# Patient Record
Sex: Female | Born: 1950 | Race: White | Hispanic: No | Marital: Married | Smoking: Never smoker
Health system: Southern US, Community
[De-identification: ages and names within clinical notes are randomized; demographics above are authoritative.]

## PROBLEM LIST (undated history)

## (undated) DIAGNOSIS — Z803 Family history of malignant neoplasm of breast: Secondary | ICD-10-CM

## (undated) DIAGNOSIS — Z87442 Personal history of urinary calculi: Secondary | ICD-10-CM

## (undated) DIAGNOSIS — D3610 Benign neoplasm of peripheral nerves and autonomic nervous system, unspecified: Secondary | ICD-10-CM

## (undated) DIAGNOSIS — E785 Hyperlipidemia, unspecified: Secondary | ICD-10-CM

## (undated) DIAGNOSIS — R0602 Shortness of breath: Secondary | ICD-10-CM

## (undated) DIAGNOSIS — Z8719 Personal history of other diseases of the digestive system: Secondary | ICD-10-CM

## (undated) DIAGNOSIS — M719 Bursopathy, unspecified: Secondary | ICD-10-CM

## (undated) DIAGNOSIS — N200 Calculus of kidney: Secondary | ICD-10-CM

## (undated) DIAGNOSIS — K219 Gastro-esophageal reflux disease without esophagitis: Secondary | ICD-10-CM

## (undated) DIAGNOSIS — M810 Age-related osteoporosis without current pathological fracture: Secondary | ICD-10-CM

## (undated) DIAGNOSIS — M1990 Unspecified osteoarthritis, unspecified site: Secondary | ICD-10-CM

## (undated) DIAGNOSIS — R0989 Other specified symptoms and signs involving the circulatory and respiratory systems: Secondary | ICD-10-CM

## (undated) HISTORY — DX: Hyperlipidemia, unspecified: E78.5

## (undated) HISTORY — DX: Shortness of breath: R06.02

## (undated) HISTORY — DX: Family history of malignant neoplasm of breast: Z80.3

## (undated) HISTORY — DX: Age-related osteoporosis without current pathological fracture: M81.0

## (undated) HISTORY — DX: Bursopathy, unspecified: M71.9

## (undated) HISTORY — PX: VULVA /PERINEUM BIOPSY: SHX319

## (undated) HISTORY — DX: Gastro-esophageal reflux disease without esophagitis: K21.9

## (undated) HISTORY — DX: Other specified symptoms and signs involving the circulatory and respiratory systems: R09.89

## (undated) HISTORY — DX: Benign neoplasm of peripheral nerves and autonomic nervous system, unspecified: D36.10

---
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
## 2001-04-20 HISTORY — PX: BREAST EXCISIONAL BIOPSY: SUR124

## 2005-03-31 ENCOUNTER — Other Ambulatory Visit: Admission: RE | Admit: 2005-03-31 | Discharge: 2005-03-31 | Payer: Self-pay | Admitting: Anesthesiology

## 2007-08-22 ENCOUNTER — Ambulatory Visit: Payer: Self-pay | Admitting: Family Medicine

## 2008-08-22 ENCOUNTER — Ambulatory Visit: Payer: Self-pay | Admitting: Family Medicine

## 2009-12-13 ENCOUNTER — Ambulatory Visit: Payer: Self-pay | Admitting: Family Medicine

## 2010-09-19 HISTORY — PX: OTHER SURGICAL HISTORY: SHX169

## 2010-09-22 ENCOUNTER — Ambulatory Visit: Payer: Self-pay | Admitting: Family Medicine

## 2010-09-26 ENCOUNTER — Ambulatory Visit: Payer: Self-pay | Admitting: Oncology

## 2010-10-06 ENCOUNTER — Ambulatory Visit: Payer: Self-pay | Admitting: Oncology

## 2010-10-10 LAB — PATHOLOGY REPORT

## 2010-10-19 ENCOUNTER — Ambulatory Visit: Payer: Self-pay | Admitting: Oncology

## 2010-11-21 ENCOUNTER — Encounter (INDEPENDENT_AMBULATORY_CARE_PROVIDER_SITE_OTHER): Payer: Self-pay | Admitting: General Surgery

## 2010-11-21 ENCOUNTER — Ambulatory Visit (INDEPENDENT_AMBULATORY_CARE_PROVIDER_SITE_OTHER): Payer: BC Managed Care – PPO | Admitting: General Surgery

## 2010-11-21 VITALS — BP 124/80 | HR 64 | Temp 96.8°F | Ht 66.0 in | Wt 170.0 lb

## 2010-11-21 DIAGNOSIS — D3610 Benign neoplasm of peripheral nerves and autonomic nervous system, unspecified: Secondary | ICD-10-CM | POA: Insufficient documentation

## 2010-11-21 DIAGNOSIS — D219 Benign neoplasm of connective and other soft tissue, unspecified: Secondary | ICD-10-CM

## 2010-11-21 NOTE — Patient Instructions (Signed)
Please call us when you decide if you want to have surgery or monitor the Schwannoma.

## 2010-11-21 NOTE — Progress Notes (Signed)
Emily Richard is a 60 y.o. female.    Chief Complaint  Patient presents with  . Other    Eval retroperitoneal tumor    HPI HPI She is referred by Dr.Kothapalli for evaluation of a left retroperitoneal tumor. She underwent a CT scan for evaluation of kidney stones. An incidental 4.5 cm x 3.3 cm soft tissue mass on the left psoas muscle inferior to the left adrenal gland that was noted. An image guided needle biopsy was performed of this mass. 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 This is asymptomatic. No left lower extremity paresthesias or weakness.  Past Medical History  Diagnosis Date  . Poor circulation Nephrolithiasis Osteoporosis   . Shortness of breath   . Family history of breast cancer     mother, aunt    Past Surgical History  Procedure Date  . Tumor biopsy 09/2010    Family History  Problem Relation Age of Onset  . Other Mother     left mastectomy 1998  . Osteoporosis Mother     Social History History  Substance Use Topics  . Smoking status: Never Smoker   . Smokeless tobacco: Not on file  . Alcohol Use: No    Allergies  Allergen Reactions  . Penicillins Hives and Itching    All over body.    Current Outpatient Prescriptions  Medication Sig Dispense Refill  . aspirin 81 MG tablet Take 81 mg by mouth daily.        . Calcium Carbonate-Vitamin D (CALTRATE 600+D PO) Take by mouth.        . fish oil-omega-3 fatty acids 1000 MG capsule Take 2 g by mouth daily.        . multivitamin-iron-minerals-folic acid (CENTRUM) chewable tablet Chew 1 tablet by mouth daily.        . raloxifene (EVISTA) 60 MG tablet Take 60 mg by mouth daily.        . rosuvastatin (CRESTOR) 5 MG tablet Take 5 mg by mouth daily.          Review of Systems ROS General:  _X_Normal __Fever__Weight change __Night sweats __Fatigue __Sleep loss  Breast:  _X_Normal __Lump __Pain __Nipple discharge __Infection __Other  Infectious  Diseases:  _X_Normal __HIV/AIDS __Tuberculosis __Hepatitis A       __ Hepatitis B __Hepatitis C __ STD __MRSA(staph infection) __Other  Dental:  _X_Normal __Dentures __Other  Cardiac  :_X_ Normal __Pacemaker __Defibrillator __Bypass surgery __High blood pressure __ Peripheral vascular disease __Heart attack __Irregular heart beat __Valve       disease  Pulmonary:  _X_Normal __Cough/Sputum __Bronchitis __Asthma __COPD                    __Short of breath __Pneumonia __Sleep Apnea  Endocrine:  _X_Normal __Diabetes __Thyroid disease __High Cholesterol __Other  Skin:  _X_Normal  __Rash __Bruise easily __Cancer __Abnormal moles __Other  Gastrointestinal:  _X_Normal __Nausea/Vomiting/Diarrhea __Colon polyp or Cancer       __Irritable Bowel disease __Poor appetite __Hiatal hernia or reflux __Ulcer       __Liver disease __Abdominal pain __Hernia __Rectal bleeding or hemorrhoids       __Other  Genitourinary:  _Normal _X__Kidney disease or stones __Prostate problems        __Blood in urine __Difficulty voiding __Incontinence/leakage __Other  Neurological:  _X_Normal __Stroke __Paralysis __Seizure __Alzheimers disease       __Headaches __Dizziness __Fainting __Weakness __Other  Hematologic/Lymphatic:  _X_Normal __Bleeding disorder __Blood clots __Anemia  __Swollen lymph nodes __Blood Transfusion __Other  Immune System:  _X_Normal __Previous/Current Cancer-type:  __Other  HEENT:  _X_Normal __Hearing loss  __Hearing aid __Ear infection __Nose bleed       __Hoarseness __Sore throat __Blurred or double vision __Glasses or contacts       __Glaucoma __Retinopathy __Macular degeneration __Other  Musculoskeletal:  __Normal _X_Joint pain __Arthritis __Other        Physical Exam Physical Exam  Constitutional: No distress.       Overweight.  HENT:  Head: Normocephalic and atraumatic.  Eyes: Conjunctivae and EOM are normal.  Neck: Normal range of motion. No JVD present.  Cardiovascular:  Normal rate and regular rhythm.   No murmur heard. Respiratory: Effort normal and breath sounds normal.  GI: Soft. Bowel sounds are normal. She exhibits no distension and no mass. There is no tenderness.  Musculoskeletal: Normal range of motion. She exhibits no edema.  Lymphadenopathy:    She has no cervical adenopathy.  Neurological: She is alert. She exhibits normal muscle tone.       Normal lower extremity motor strength and sensation.  Skin: Skin is warm and dry. No rash noted.  Psychiatric: She has a normal mood and affect. Judgment normal.     Blood pressure 124/80, pulse 64, temperature 96.8 F (36 C), temperature source Temporal, height 5\' 6"  (1.676 m), weight 170 lb (77.111 kg).  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  Plan: We discussed 2 options. The first option would be serial CT scans to follow the mass. If it became larger and excision would be the plan. The second option would be to go straight to excision. We did discuss the procedure and risks of this. Risks include but not limited to bleeding, infection, wound problems, anesthesia, permanent nerve injury and left lower extremity weakness, accidental injury to intra-abdominal organs.  She and her husband want to think about this and will call us with their decision. If she decides and observation, will obtain a repeat CT scan in September. Of note is that her mother is going to have joint replacement surgery later this month and she will take care of her.  Emily Richard 11/21/2010, 6:10 PM

## 2010-11-24 ENCOUNTER — Telehealth (INDEPENDENT_AMBULATORY_CARE_PROVIDER_SITE_OTHER): Payer: Self-pay

## 2010-11-24 NOTE — Telephone Encounter (Signed)
Contacted Radiology at [facility].  They will burn pt's CT scan to a CD.  Called pt and gave them our mailing address.  They will go by hospital to sign release and get CD mailed to [place].

## 2010-12-01 ENCOUNTER — Telehealth (INDEPENDENT_AMBULATORY_CARE_PROVIDER_SITE_OTHER): Payer: Self-pay | Admitting: General Surgery

## 2010-12-02 ENCOUNTER — Telehealth (INDEPENDENT_AMBULATORY_CARE_PROVIDER_SITE_OTHER): Payer: Self-pay

## 2010-12-02 NOTE — Telephone Encounter (Signed)
Spoke with the pt. This morning.  We received the CD of her recent scan.  I told her Dr. Abbey Chatters would review it this week and we would let her know if the decision was to defer surgery at this time, and order another CT in December.

## 2010-12-03 ENCOUNTER — Telehealth (INDEPENDENT_AMBULATORY_CARE_PROVIDER_SITE_OTHER): Payer: Self-pay | Admitting: General Surgery

## 2010-12-04 ENCOUNTER — Other Ambulatory Visit (INDEPENDENT_AMBULATORY_CARE_PROVIDER_SITE_OTHER): Payer: Self-pay | Admitting: General Surgery

## 2010-12-04 ENCOUNTER — Telehealth (INDEPENDENT_AMBULATORY_CARE_PROVIDER_SITE_OTHER): Payer: Self-pay | Admitting: General Surgery

## 2010-12-04 DIAGNOSIS — D200 Benign neoplasm of soft tissue of retroperitoneum: Secondary | ICD-10-CM

## 2010-12-04 NOTE — Telephone Encounter (Signed)
I reviewed the CT on a CD ROM disc.  She has decided not to pursue surgery at this time, but rather have radiographic monitoring as we had discussed.  Thus, will arrange for a repeat CT scan in early September and call her with the results.  If the benign nerve sheath tumor gets larger, I recommended removal and she understands this.

## 2010-12-09 ENCOUNTER — Ambulatory Visit
Admission: RE | Admit: 2010-12-09 | Discharge: 2010-12-09 | Disposition: A | Discharge status: Home / Self Care | Payer: BC Managed Care – PPO | Source: Ambulatory Visit | Attending: General Surgery | Admitting: General Surgery

## 2010-12-09 DIAGNOSIS — D200 Benign neoplasm of soft tissue of retroperitoneum: Secondary | ICD-10-CM

## 2010-12-09 MED ORDER — IOHEXOL 300 MG/ML  SOLN
100.0000 mL | Freq: Once | INTRAMUSCULAR | Status: AC | PRN
  Administered 2010-12-09: 100 mL via INTRAVENOUS

## 2010-12-12 ENCOUNTER — Telehealth (INDEPENDENT_AMBULATORY_CARE_PROVIDER_SITE_OTHER): Payer: Self-pay | Admitting: General Surgery

## 2010-12-12 NOTE — Telephone Encounter (Signed)
Spoke with her yesterday morning and today.  CT scan done her in [place] was compared with outside CT scan by Dr. Gery Pray in radiology and an addendum was added.  Essentially, the size of the tumor has not significantly changed.  She also has a 6mm ureteral stone at the UVJ with mild proximal hydroureter.  She has an appointment with her Urologist on 12/15/10 as it is symptomatic.  As for the benign retroperitoneal nerve sheath tumor, we agreed on a repeat CT scan 02/2011 and followup appointment.

## 2010-12-19 ENCOUNTER — Telehealth (INDEPENDENT_AMBULATORY_CARE_PROVIDER_SITE_OTHER): Payer: Self-pay

## 2010-12-19 NOTE — Telephone Encounter (Signed)
LMOM for a follow up scan in November and clinic appt afterwards.

## 2011-02-09 ENCOUNTER — Other Ambulatory Visit (INDEPENDENT_AMBULATORY_CARE_PROVIDER_SITE_OTHER): Payer: Self-pay | Admitting: General Surgery

## 2011-02-09 DIAGNOSIS — D3610 Benign neoplasm of peripheral nerves and autonomic nervous system, unspecified: Secondary | ICD-10-CM

## 2011-02-18 ENCOUNTER — Ambulatory Visit
Admission: RE | Admit: 2011-02-18 | Discharge: 2011-02-18 | Disposition: A | Discharge status: Home / Self Care | Payer: BC Managed Care – PPO | Source: Ambulatory Visit | Attending: General Surgery | Admitting: General Surgery

## 2011-02-18 DIAGNOSIS — D3610 Benign neoplasm of peripheral nerves and autonomic nervous system, unspecified: Secondary | ICD-10-CM

## 2011-02-18 MED ORDER — IOHEXOL 300 MG/ML  SOLN
100.0000 mL | Freq: Once | INTRAMUSCULAR | Status: AC | PRN
  Administered 2011-02-18: 100 mL via INTRAVENOUS

## 2011-03-04 ENCOUNTER — Encounter (INDEPENDENT_AMBULATORY_CARE_PROVIDER_SITE_OTHER): Payer: BC Managed Care – PPO | Admitting: General Surgery

## 2011-03-25 ENCOUNTER — Encounter (INDEPENDENT_AMBULATORY_CARE_PROVIDER_SITE_OTHER): Payer: Self-pay | Admitting: General Surgery

## 2011-03-25 ENCOUNTER — Ambulatory Visit (INDEPENDENT_AMBULATORY_CARE_PROVIDER_SITE_OTHER): Payer: BC Managed Care – PPO | Admitting: General Surgery

## 2011-03-25 VITALS — BP 138/76 | HR 64 | Temp 97.6°F | Resp 16 | Ht 66.0 in | Wt 174.6 lb

## 2011-03-25 DIAGNOSIS — D3610 Benign neoplasm of peripheral nerves and autonomic nervous system, unspecified: Secondary | ICD-10-CM

## 2011-03-25 DIAGNOSIS — D219 Benign neoplasm of connective and other soft tissue, unspecified: Secondary | ICD-10-CM

## 2011-03-25 NOTE — Patient Instructions (Signed)
You will need a repeat CT scan in April 2013.

## 2011-03-25 NOTE — Progress Notes (Signed)
Patient ID: Emily Richard, female   DOB: [birth year], 60 y.o.   MRN: [id number]  Chief Complaint  Patient presents with  . Follow-up    est pt- eval schwannoma and discuss CT results    HPI Emily Richard is a 60 y.o. female.   HPI  She is here for followup of her left retroperitoneal schwannoma. She's been having some mild discomfort in the left lower quadrant were certain movements. Her CT scan in October demonstrated no change in the size of the schwannoma.  Past Medical History  Diagnosis Date  . Poor circulation   . Shortness of breath   . Family history of breast cancer     mother, aunt  . Hyperlipidemia     Past Surgical History  Procedure Date  . Tumor biopsy 09/2010    Family History  Problem Relation Age of Onset  . Other Mother     left mastectomy 1998  . Osteoporosis Mother   . Cancer Mother     breast  . Cancer Maternal Aunt     breast    Social History History  Substance Use Topics  . Smoking status: Never Smoker   . Smokeless tobacco: Not on file  . Alcohol Use: No    Allergies  Allergen Reactions  . Penicillins Hives and Itching    All over body.    Current Outpatient Prescriptions  Medication Sig Dispense Refill  . aspirin 81 MG tablet Take 81 mg by mouth daily.        Marland Kitchen atorvastatin (LIPITOR) 10 MG tablet daily.      . Calcium Carbonate-Vitamin D (CALTRATE 600+D PO) Take by mouth.        . fish oil-omega-3 fatty acids 1000 MG capsule Take 2 g by mouth daily.        . multivitamin-iron-minerals-folic acid (CENTRUM) chewable tablet Chew 1 tablet by mouth daily.        . raloxifene (EVISTA) 60 MG tablet Take 60 mg by mouth daily.          Review of Systems Review of Systems  Gastrointestinal:       See hpi    Blood pressure 138/76, pulse 64, temperature 97.6 F (36.4 C), temperature source Temporal, resp. rate 16, height 5\' 6"  (1.676 m), weight 174 lb 9.6 oz (79.198 kg).  Physical Exam Physical Exam  Constitutional:       Overweight  in NAD.  Abdominal: Soft. She exhibits no mass. There is tenderness (mild llq).  Genitourinary:       No inguinal hernias.    Data Reviewed CT scan  Assessment    Left retroperitoneal schwannoma- stable in size.    Plan       Repeat CT scan in April 2013. Followup appointment after that.   Davison Ohms J 03/25/2011, 10:38 AM

## 2011-04-16 NOTE — Telephone Encounter (Signed)
Close encounter 

## 2011-05-19 ENCOUNTER — Ambulatory Visit: Payer: Self-pay | Admitting: Family Medicine

## 2011-08-31 ENCOUNTER — Telehealth (INDEPENDENT_AMBULATORY_CARE_PROVIDER_SITE_OTHER): Payer: Self-pay | Admitting: General Surgery

## 2011-08-31 NOTE — Telephone Encounter (Signed)
Pt calling to report new pain in Lt hip.  She states she is due to be seen for 6 month follow-up (scheduled,) but a CT is due about now.  Her Lt hip and whole left side is painful, interrupting sleep now, too.

## 2011-09-01 ENCOUNTER — Other Ambulatory Visit (INDEPENDENT_AMBULATORY_CARE_PROVIDER_SITE_OTHER): Payer: Self-pay | Admitting: General Surgery

## 2011-09-01 DIAGNOSIS — R1900 Intra-abdominal and pelvic swelling, mass and lump, unspecified site: Secondary | ICD-10-CM

## 2011-09-08 ENCOUNTER — Telehealth (INDEPENDENT_AMBULATORY_CARE_PROVIDER_SITE_OTHER): Payer: Self-pay

## 2011-09-08 NOTE — Telephone Encounter (Signed)
LMOV pt's f/u appt is 6/19.  We will call her with CT results from 09/09/11.

## 2011-09-09 ENCOUNTER — Ambulatory Visit
Admission: RE | Admit: 2011-09-09 | Discharge: 2011-09-09 | Disposition: A | Discharge status: Home / Self Care | Payer: BC Managed Care – PPO | Source: Ambulatory Visit | Attending: General Surgery | Admitting: General Surgery

## 2011-09-09 DIAGNOSIS — R1900 Intra-abdominal and pelvic swelling, mass and lump, unspecified site: Secondary | ICD-10-CM

## 2011-09-09 MED ORDER — IOHEXOL 300 MG/ML  SOLN
100.0000 mL | Freq: Once | INTRAMUSCULAR | Status: AC | PRN
  Administered 2011-09-09: 100 mL via INTRAVENOUS

## 2011-09-11 ENCOUNTER — Telehealth (INDEPENDENT_AMBULATORY_CARE_PROVIDER_SITE_OTHER): Payer: Self-pay

## 2011-09-11 ENCOUNTER — Encounter (INDEPENDENT_AMBULATORY_CARE_PROVIDER_SITE_OTHER): Payer: Self-pay | Admitting: General Surgery

## 2011-09-11 DIAGNOSIS — M25552 Pain in left hip: Secondary | ICD-10-CM

## 2011-09-11 NOTE — Telephone Encounter (Signed)
I called the pt and notified her that Dr Abbey Chatters says her pain could be from the tumor but we are not sure.  He wants to see an orthopedic specialist to rule out any bone disease.  He recommends Dr Luiz Blare, Dr Margreta Journey or someone in [facility].  The pt agrees.  She has not seen anyone else before.  We will arrange

## 2011-09-11 NOTE — Telephone Encounter (Signed)
The pt called for her ct results.  I gave those to her and told her the tumor is stable.   She states she still has pain in the left groin and hip area especially when she walks.  She wants to know what to do about that and if she should come here or follow up with another md.  I will contact Dr Abbey Chatters.

## 2011-09-11 NOTE — Progress Notes (Signed)
Patient ID: Emily Richard, female   DOB: February 07, 1951, 61 y.o.   MRN: [id number] I spoke with her regarding her CT scan report.  The schwannoma is unchanged.  She has been having more left hip and groin pain.  She thinks she may have some arthritis in her left hip.  Will arrange for an Orthopedic Surgery consult regarding her left hip pain.  If they do not find any specific hip pathology to explain the pain, then we may need to proceed with removal of the schwannoma.  Would need a Urologist to be involved with the operation as well.  She understands that permanent nerve impairment could occur as a result of the operation.  Will see her next month in the office.

## 2011-09-15 ENCOUNTER — Telehealth (INDEPENDENT_AMBULATORY_CARE_PROVIDER_SITE_OTHER): Payer: Self-pay

## 2011-09-15 NOTE — Telephone Encounter (Signed)
The pt called this am because she was referred to Dr Luiz Blare this am and is now at [facility] but they don't have her appointment down.  That is not Dr Luiz Blare' office.  I checked into it and found out where Dr Luiz Blare is and it's Music therapist.  I advised the pt where to go.  They are aware she may be late.

## 2011-09-21 ENCOUNTER — Ambulatory Visit (INDEPENDENT_AMBULATORY_CARE_PROVIDER_SITE_OTHER): Payer: BC Managed Care – PPO | Admitting: General Surgery

## 2011-09-23 ENCOUNTER — Ambulatory Visit (INDEPENDENT_AMBULATORY_CARE_PROVIDER_SITE_OTHER): Payer: BC Managed Care – PPO | Admitting: General Surgery

## 2011-10-07 ENCOUNTER — Encounter (INDEPENDENT_AMBULATORY_CARE_PROVIDER_SITE_OTHER): Payer: Self-pay | Admitting: General Surgery

## 2011-10-07 ENCOUNTER — Ambulatory Visit (INDEPENDENT_AMBULATORY_CARE_PROVIDER_SITE_OTHER): Payer: BC Managed Care – PPO | Admitting: General Surgery

## 2011-10-07 VITALS — BP 130/81 | HR 69 | Temp 98.1°F | Resp 16 | Ht 66.5 in | Wt 164.4 lb

## 2011-10-07 DIAGNOSIS — D3610 Benign neoplasm of peripheral nerves and autonomic nervous system, unspecified: Secondary | ICD-10-CM

## 2011-10-07 DIAGNOSIS — D219 Benign neoplasm of connective and other soft tissue, unspecified: Secondary | ICD-10-CM

## 2011-10-07 NOTE — Patient Instructions (Signed)
We will schedule you for a repeat CT about 6 months from your last CT.

## 2011-10-07 NOTE — Progress Notes (Signed)
Patient ID: Emily Richard, female   DOB: [birth year], 61 y.o.   MRN: [id number]  Chief Complaint  Patient presents with  . Follow-up    reck rectal    HPI [name] is a 61 y.o. female.   HPI  She is here for followup of her left retroperitoneal schwannoma. She's been having some moderate discomfort in the left hip area.  I referred her to Dr. Jodi Geralds for evaluation.  She was found to have some left hip pathology and was given a steroid injection which has helped her significantly. Her CT scan in May demonstrated no change in the size of the schwannoma.  Past Medical History  Diagnosis Date  . Poor circulation   . Shortness of breath   . Family history of breast cancer     mother, aunt  . Hyperlipidemia     Left retroperitoneal schwannoma  Past Surgical History  Procedure Date  . Tumor biopsy 09/2010    Family History  Problem Relation Age of Onset  . Other Mother     left mastectomy 1998  . Osteoporosis Mother   . Cancer Mother     breast  . Cancer Maternal Aunt     breast    Social History History  Substance Use Topics  . Smoking status: Never Smoker   . Smokeless tobacco: Not on file  . Alcohol Use: No    Allergies  Allergen Reactions  . Penicillins Hives and Itching    All over body.    Current Outpatient Prescriptions  Medication Sig Dispense Refill  . aspirin 81 MG tablet Take 81 mg by mouth daily.        Marland Kitchen atorvastatin (LIPITOR) 10 MG tablet daily.      . Calcium Carbonate-Vitamin D (CALTRATE 600+D PO) Take by mouth.        . Clobetasol Prop Emollient Base 0.05 % emollient cream Apply topically 3 (three) times a week.      . Estrogens, Conjugated (PREMARIN VA) Place vaginally.      . fish oil-omega-3 fatty acids 1000 MG capsule Take 2 g by mouth daily.        . multivitamin-iron-minerals-folic acid (CENTRUM) chewable tablet Chew 1 tablet by mouth daily.        . raloxifene (EVISTA) 60 MG tablet Take 60 mg by mouth daily.          Review of  Systems Review of Systems  Gastrointestinal:       See hpi    Blood pressure 130/81, pulse 69, temperature 98.1 F (36.7 C), temperature source Temporal, resp. rate 16, height 5' 6.5" (1.689 m), weight 164 lb 6.4 oz (74.571 kg), SpO2 98.00%.  Physical Exam Physical Exam  Constitutional:       Overweight in NAD.  Abdominal: Soft, no mass or tenderness. Lymph nodes:  No inguinal or cervical adenopathy   Data Reviewed CT scan  Assessment    Left retroperitoneal schwannoma- unchanged and asymptomatic in my opinion.  Left hip trochanteric bursitis-improved after steroid injection.    Plan       Repeat CT scan in 6 months. Followup appointment after that.   Peggye Poon J 10/07/2011, 12:45 PM

## 2012-03-09 ENCOUNTER — Ambulatory Visit
Admission: RE | Admit: 2012-03-09 | Discharge: 2012-03-09 | Disposition: A | Discharge status: Home / Self Care | Payer: BC Managed Care – PPO | Source: Ambulatory Visit | Attending: General Surgery | Admitting: General Surgery

## 2012-03-09 DIAGNOSIS — D3610 Benign neoplasm of peripheral nerves and autonomic nervous system, unspecified: Secondary | ICD-10-CM

## 2012-03-09 MED ORDER — IOHEXOL 300 MG/ML  SOLN
100.0000 mL | Freq: Once | INTRAMUSCULAR | Status: AC | PRN
  Administered 2012-03-09: 100 mL via INTRAVENOUS

## 2012-03-11 ENCOUNTER — Telehealth (INDEPENDENT_AMBULATORY_CARE_PROVIDER_SITE_OTHER): Payer: Self-pay

## 2012-03-11 NOTE — Telephone Encounter (Signed)
LMOV pt's CT scan showed stable left retroperitoneal soft tissue mass.  No changes since last imaging report.  Her appointment on 12/30 was rescheduled to 03/30/12 with Dr. Abbey Chatters.  He will go over the results in detail at that time.

## 2012-03-30 ENCOUNTER — Ambulatory Visit (INDEPENDENT_AMBULATORY_CARE_PROVIDER_SITE_OTHER): Payer: BC Managed Care – PPO | Admitting: General Surgery

## 2012-03-30 ENCOUNTER — Encounter (INDEPENDENT_AMBULATORY_CARE_PROVIDER_SITE_OTHER): Payer: Self-pay | Admitting: General Surgery

## 2012-03-30 VITALS — BP 110/58 | HR 72 | Temp 97.7°F | Resp 18 | Ht 66.0 in | Wt 165.8 lb

## 2012-03-30 DIAGNOSIS — D219 Benign neoplasm of connective and other soft tissue, unspecified: Secondary | ICD-10-CM

## 2012-03-30 DIAGNOSIS — D3610 Benign neoplasm of peripheral nerves and autonomic nervous system, unspecified: Secondary | ICD-10-CM

## 2012-03-30 NOTE — Patient Instructions (Signed)
We will repeat the CT in 6 months.

## 2012-03-30 NOTE — Progress Notes (Signed)
Patient ID: Emily Richard, female   DOB: 25-Feb-1951, 61 y.o.   MRN: [id number]  No chief complaint on file.   HPI Emily Richard is a 61 y.o. female.   HPI  She is here for followup of her left retroperitoneal benign schwannoma (biopsy proven).  She is not having any abdominal complaints.  Past Medical History  Diagnosis Date  . Poor circulation   . Shortness of breath   . Family history of breast cancer     mother, aunt  . Hyperlipidemia   . Schwannoma     Past Surgical History  Procedure Date  . Tumor biopsy 09/2010    Family History  Problem Relation Age of Onset  . Other Mother     left mastectomy 1998  . Osteoporosis Mother   . Cancer Mother     breast  . Cancer Maternal Aunt     breast    Social History History  Substance Use Topics  . Smoking status: Never Smoker   . Smokeless tobacco: Not on file  . Alcohol Use: No    Allergies  Allergen Reactions  . Penicillins Hives and Itching    All over body.    Current Outpatient Prescriptions  Medication Sig Dispense Refill  . aspirin 81 MG tablet Take 81 mg by mouth daily.        Marland Kitchen atorvastatin (LIPITOR) 10 MG tablet daily.      . Calcium Carbonate-Vitamin D (CALTRATE 600+D PO) Take by mouth.        . Clobetasol Prop Emollient Base 0.05 % emollient cream Apply topically 3 (three) times a week.      . Estrogens, Conjugated (PREMARIN VA) Place vaginally.      . fish oil-omega-3 fatty acids 1000 MG capsule Take 2 g by mouth daily.        . multivitamin-iron-minerals-folic acid (CENTRUM) chewable tablet Chew 1 tablet by mouth daily.        . raloxifene (EVISTA) 60 MG tablet Take 60 mg by mouth daily.          Review of Systems Review of Systems  Gastrointestinal: Negative for abdominal pain.  Musculoskeletal: Positive for arthralgias.  Psychiatric/Behavioral: Behavioral problem: left hip.    Blood pressure 110/58, pulse 72, temperature 97.7 F (36.5 C), temperature source Temporal, resp. rate 18, height  5\' 6"  (1.676 m), weight 165 lb 12.8 oz (75.206 kg).  Physical Exam Physical Exam  Constitutional: She appears well-developed and well-nourished. No distress.  Abdominal: Soft. She exhibits no mass. There is no tenderness.    Data Reviewed CT scan-schwannoma size essentially unchanged.  Assessment    Left retroperitoneal schwannoma-essentially unchanged.    Plan    Repeat CT and follow-up visit in 6 months.       Mat Stuard J 03/30/2012, 11:53 AM

## 2012-04-18 ENCOUNTER — Ambulatory Visit (INDEPENDENT_AMBULATORY_CARE_PROVIDER_SITE_OTHER): Payer: BC Managed Care – PPO | Admitting: General Surgery

## 2012-06-22 ENCOUNTER — Ambulatory Visit: Payer: Self-pay | Admitting: Family Medicine

## 2012-10-31 ENCOUNTER — Telehealth (INDEPENDENT_AMBULATORY_CARE_PROVIDER_SITE_OTHER): Payer: Self-pay | Admitting: General Surgery

## 2012-10-31 DIAGNOSIS — D3610 Benign neoplasm of peripheral nerves and autonomic nervous system, unspecified: Secondary | ICD-10-CM

## 2012-10-31 NOTE — Telephone Encounter (Signed)
Patient calling to set up her 6 month CT for Left retroperitoneal schwannoma and follow up with Dr Abbey Chatters. Appt made for follow up with Dr Abbey Chatters by the front desk 11/07/2012. Order placed for CT and given to referral coordinator to set up and call patient.

## 2012-10-31 NOTE — Telephone Encounter (Signed)
Patient is aware of appt at [address]  11/02/12 at 345 pm .she will pick up contrast tomorrow

## 2012-10-31 NOTE — Telephone Encounter (Signed)
Noted and agree. 

## 2012-11-02 ENCOUNTER — Ambulatory Visit
Admission: RE | Admit: 2012-11-02 | Discharge: 2012-11-02 | Disposition: A | Discharge status: Home / Self Care | Payer: BC Managed Care – PPO | Source: Ambulatory Visit | Attending: General Surgery | Admitting: General Surgery

## 2012-11-02 DIAGNOSIS — D3610 Benign neoplasm of peripheral nerves and autonomic nervous system, unspecified: Secondary | ICD-10-CM

## 2012-11-02 MED ORDER — IOHEXOL 300 MG/ML  SOLN
100.0000 mL | Freq: Once | INTRAMUSCULAR | Status: AC | PRN
  Administered 2012-11-02: 100 mL via INTRAVENOUS

## 2012-11-03 ENCOUNTER — Telehealth (INDEPENDENT_AMBULATORY_CARE_PROVIDER_SITE_OTHER): Payer: Self-pay

## 2012-11-03 NOTE — Telephone Encounter (Signed)
Pt called stating she saw out # on her phone today and missed a call from our office. No not in epic. I advised pt that I will send msg to Dr Abbey Chatters and his assistant to determine if he was calling pt with CT result. Pt can be reached at [phone].

## 2012-11-04 ENCOUNTER — Telehealth (INDEPENDENT_AMBULATORY_CARE_PROVIDER_SITE_OTHER): Payer: Self-pay

## 2012-11-04 NOTE — Telephone Encounter (Signed)
CT shows no change in Schwannoma.  She needs a follow up appointment.

## 2012-11-04 NOTE — Telephone Encounter (Signed)
Pt notified no change on her CT scan.  She will discuss w/ Dr. Abbey Chatters on 11/07/12 at her appointment.

## 2012-11-07 ENCOUNTER — Ambulatory Visit (INDEPENDENT_AMBULATORY_CARE_PROVIDER_SITE_OTHER): Payer: BC Managed Care – PPO | Admitting: General Surgery

## 2012-11-23 ENCOUNTER — Ambulatory Visit (INDEPENDENT_AMBULATORY_CARE_PROVIDER_SITE_OTHER): Payer: BC Managed Care – PPO | Admitting: General Surgery

## 2012-11-23 ENCOUNTER — Encounter (INDEPENDENT_AMBULATORY_CARE_PROVIDER_SITE_OTHER): Payer: Self-pay | Admitting: General Surgery

## 2012-11-23 VITALS — BP 122/64 | HR 72 | Temp 98.7°F | Resp 14 | Ht 65.0 in | Wt 169.8 lb

## 2012-11-23 DIAGNOSIS — D219 Benign neoplasm of connective and other soft tissue, unspecified: Secondary | ICD-10-CM

## 2012-11-23 DIAGNOSIS — D3610 Benign neoplasm of peripheral nerves and autonomic nervous system, unspecified: Secondary | ICD-10-CM

## 2012-11-23 NOTE — Patient Instructions (Signed)
Call if you have the nerve pain in your thigh as we discussed.  Will repeat CT and appointment in one year.

## 2012-11-23 NOTE — Progress Notes (Signed)
Patient ID: Emily Richard, female   DOB: November 11, 1950, 62 y.o.   MRN: [id number]  Chief Complaint  Patient presents with  . Follow-up    LTFU rectal reck    HPI Emily Richard is a 62 y.o. female.   HPI  She is here for another followup visit of her left retroperitoneal benign schwannoma (biopsy proven).  She is asymptomatic from this.  Follow up CT shows no change in size of Schwannoma.  Past Medical History  Diagnosis Date  . Poor circulation   . Shortness of breath   . Family history of breast cancer     mother, aunt  . Hyperlipidemia   . Schwannoma   . GERD (gastroesophageal reflux disease)   . Osteoporosis     Past Surgical History  Procedure Laterality Date  . Tumor biopsy  09/2010    Family History  Problem Relation Age of Onset  . Other Mother     left mastectomy 1998  . Osteoporosis Mother   . Cancer Mother     breast  . Cancer Maternal Aunt     breast    Social History History  Substance Use Topics  . Smoking status: Never Smoker   . Smokeless tobacco: Not on file  . Alcohol Use: No    Allergies  Allergen Reactions  . Penicillins Hives and Itching    All over body.    Current Outpatient Prescriptions  Medication Sig Dispense Refill  . aspirin 81 MG tablet Take 81 mg by mouth daily.        Marland Kitchen atorvastatin (LIPITOR) 10 MG tablet 40 mg daily.       . Calcium Carbonate-Vitamin D (CALTRATE 600+D PO) Take by mouth.        . clobetasol (TEMOVATE) 0.05 % GEL Apply topically 2 (two) times daily.      . Clobetasol Prop Emollient Base 0.05 % emollient cream Apply topically 3 (three) times a week.      . fish oil-omega-3 fatty acids 1000 MG capsule Take 2 g by mouth daily.        . medroxyPROGESTERone (PROVERA) 2.5 MG tablet       . multivitamin-iron-minerals-folic acid (CENTRUM) chewable tablet Chew 1 tablet by mouth daily.        Marland Kitchen omeprazole (PRILOSEC) 20 MG capsule       . OSPHENA 60 MG TABS       . raloxifene (EVISTA) 60 MG tablet Take 60 mg by mouth  daily.         No current facility-administered medications for this visit.    Review of Systems Review of Systems  Constitutional: Negative.   Gastrointestinal: Negative for abdominal pain.  Musculoskeletal: Arthralgias: left hip which is better.  Psychiatric/Behavioral: Behavioral problem: left hip.    Blood pressure 122/64, pulse 72, temperature 98.7 F (37.1 C), temperature source Temporal, resp. rate 14, height 5\' 5"  (1.651 m), weight 169 lb 12.8 oz (77.021 kg).  Physical Exam Physical Exam  Constitutional: She appears well-developed and well-nourished. No distress.  Abdominal: Soft. She exhibits no mass. There is no tenderness.    Data Reviewed CT scan-schwannoma size essentially unchanged.  Assessment    Left retroperitoneal schwannoma-essentially unchanged.    Plan    Repeat CT and follow-up visit in 12 months.       Kaylaann Mountz J 11/23/2012, 10:25 AM

## 2013-01-17 ENCOUNTER — Encounter (INDEPENDENT_AMBULATORY_CARE_PROVIDER_SITE_OTHER): Payer: Self-pay

## 2013-06-13 ENCOUNTER — Ambulatory Visit (INDEPENDENT_AMBULATORY_CARE_PROVIDER_SITE_OTHER): Payer: BC Managed Care – PPO | Admitting: Podiatrist

## 2013-06-13 ENCOUNTER — Ambulatory Visit (INDEPENDENT_AMBULATORY_CARE_PROVIDER_SITE_OTHER): Payer: BC Managed Care – PPO

## 2013-06-13 ENCOUNTER — Encounter: Payer: Self-pay | Admitting: Podiatrist

## 2013-06-13 VITALS — BP 122/62 | HR 71 | Resp 18

## 2013-06-13 DIAGNOSIS — R52 Pain, unspecified: Secondary | ICD-10-CM

## 2013-06-13 DIAGNOSIS — M21619 Bunion of unspecified foot: Secondary | ICD-10-CM

## 2013-06-13 DIAGNOSIS — M7150 Other bursitis, not elsewhere classified, unspecified site: Secondary | ICD-10-CM

## 2013-06-13 NOTE — Progress Notes (Signed)
   Subjective:    Patient ID: Emily Richard, female    DOB: Feb 06, 1951, 63 y.o.   MRN: [id number]  HPI it has been going on for a month on for about a month on the toe on my right foot and sharp pain and hurts with or without shoes and red on the joint area and no swelling    Review of Systems  Cardiovascular:       Carotid arteries  Musculoskeletal: Positive for gait problem.  All other systems reviewed and are negative.       Objective:   Physical Exam GENERAL APPEARANCE: Alert, conversant. Appropriately groomed. No acute distress.  VASCULAR: Pedal pulses palpable at 2/4 DP and PT bilateral.  Capillary refill time is immediate to all digits,  Proximal to distal cooling it warm to warm.  Digital hair growth is present bilateral  NEUROLOGIC: sensation is intact epicritically and protectively to 5.07 monofilament at 5/5 sites bilateral.  Light touch is intact bilateral, vibratory sensation intact bilateral, achilles tendon reflex is intact bilateral.  MUSCULOSKELETAL: mild bunion deformity is present on the right foot.  Overlying painful bursae is also present and tender.  Pain with range of motion is present.  No crepitus is noted, range of motion 60 degrees dorsiflexion and 20 degrees plantar flexion DERMATOLOGIC: skin color, texture, and turger are within normal limits.  Bursa overlying the first metatarsal head is present right foot.      Assessment & Plan:  Bunion right with bursitis  Plan:  Discussed conservative and surgical therapies with the patient. At this time she's getting ready to take care of her mother with cancer treatment and I recommended a steroid injection into the joint to help her by. We also discussed surgical options including a bunionectomy with screw fixation. And gave her information about the bunionectomy and she will call if she would like to schedule this procedure. The skin was prepped with alcohol and a dexamethasone and Marcaine mixture was infiltrated  into the prominent bursa and first metatarsal phalangeal joint right without complication. The patient tolerated this well and she will be seen back for followup at her request.

## 2013-06-13 NOTE — Patient Instructions (Signed)
Bunionectomy A bunionectomy is surgery to remove a bunion. A bunion is an enlargement of the joint at the base of the big toe. It is made up of bone and soft tissue on the inside part of the joint. Over time, a painful lump appears on the inside of the joint. The big toe begins to point inward toward the second toe. New bone growth can occur and a bone spur may form. The pain eventually causes difficulty walking. A bunion usually results from inflammation caused by the irritation of poorly fitting shoes. It often begins later in life. A bunionectomy is performed when nonsurgical treatment no longer works. When surgery is needed, the extent of the procedure will depend on the degree of deformity of the foot. Your surgeon will discuss with you the different procedures and what will work best for you depending on your age and health. LET YOUR CAREGIVER KNOW ABOUT:   Previous problems with anesthetics or medicines used to numb the skin.  Allergies to dyes, iodine, foods, and/or latex.  Medicines taken including herbs, eye drops, prescription medicines (especially medicines used to "thin the blood"), aspirin and other over-the-counter medicines, and steroids (by mouth or as a cream).  History of bleeding or blood problems.  Possibility of pregnancy, if this applies.  History of blood clots in your legs and/or lungs .  Previous surgery.  Other important health problems. RISKS AND COMPLICATIONS   Infection.  Pain.  Nerve damage.  Possibility that the bunion will recur. BEFORE THE PROCEDURE  You should be present 60 minutes prior to your procedure or as directed.  PROCEDURE  Surgery is often done so that you can go home the same day (outpatient). It may be done in a hospital or in an outpatient surgical center. An anesthetic will be used to help you sleep during the procedure. Sometimes, a spinal anesthetic is used to make you numb below the waist. A cut (incision) is made over the swollen  area at the first joint of the big toe. The enlarged lump will be removed. If there is a need to reposition the bones of the big toe, this may require more than 1 incision. The bone itself may need to be cut. Screws and wires may be used in the repair. These can be removed at a later date. In severe cases, the entire joint may need to be removed and a joint replacement inserted. When done, the incision is closed with stitches (sutures). Skin adhesive strips may be added for reinforcement. They help hold the incision closed.  AFTER THE PROCEDURE  Compression bandages (dressings) are then wrapped around the wound. This helps to keep the foot in alignment and reduce swelling. Your foot will be monitored for bleeding and swelling. You will need to stay for a few hours in the recovery area before being discharged. This allows time for the anesthesia to wear off. You will be discharged home when you are awake, stable, and doing well. HOME CARE INSTRUCTIONS   You can expect to return to normal activities within 6 to 8 weeks after surgery. The foot is at increased risk for swelling for several months. When you can expect to bear weight on the operated foot will depend on the extent of your surgery. The milder the deformity, the less tissue is removed and the sooner the return to normal activity level. During the recovery period, a special shoe, boot, or cast may be worn to accommodate the surgical bandage and to help provide stability   to the foot.  Once you are home, an ice pack applied to the operative site may help with discomfort and keep swelling down. Stop using the ice if it causes discomfort.  Keep your feet raised (elevated) when possible to lessen swelling.  If you have an elastic bandage on your foot and you have numbness, tingling, or your foot becomes cold and blue, adjust the bandage to make it comfortable.  Change dressings as directed.  Keep the wound dry and clean. The wound may be washed  gently with soap and water. Gently blot dry without rubbing. Do not take baths or use swimming pools or hot tubs for 10 days, or as instructed by your caregiver.  Only take over-the-counter or prescription medicines for pain, discomfort, or fever as directed by your caregiver.  You may continue a normal diet as directed.  For activity, use crutches with no weight bearing or your orthopedic shoe as directed. Continue to use crutches or a cane as directed until you can stand without causing pain. SEEK MEDICAL CARE IF:   You have redness, swelling, bruising, or increasing pain in the wound.  There is pus coming from the wound.  You have drainage from a wound lasting longer than 1 day.  You have an oral temperature above 102 F (38.9 C).  You notice a bad smell coming from the wound or dressing.  The wound breaks open after sutures have been removed.  You develop dizzy episodes or fainting while standing.  You have persistent nausea or vomiting.  Your toes become cold.  Pain is not relieved with medicines. SEEK IMMEDIATE MEDICAL CARE IF:   You develop a rash.  You have difficulty breathing.  You develop any reaction or side effects to medicines given.  Your toes are numb or blue, or you have severe pain. MAKE SURE YOU:   Understand these instructions.  Will watch your condition.  Will get help right away if you are not doing well or get worse. Document Released: 03/20/2005 Document Revised: 06/29/2011 Document Reviewed: 04/25/2007 [employer] Patient Information 2014 [employer].  

## 2013-07-10 ENCOUNTER — Ambulatory Visit: Payer: Self-pay | Admitting: Physician Assistant

## 2013-08-25 ENCOUNTER — Ambulatory Visit: Payer: Self-pay | Admitting: Gastroenterology

## 2013-10-23 ENCOUNTER — Telehealth (INDEPENDENT_AMBULATORY_CARE_PROVIDER_SITE_OTHER): Payer: Self-pay

## 2013-10-23 ENCOUNTER — Other Ambulatory Visit (INDEPENDENT_AMBULATORY_CARE_PROVIDER_SITE_OTHER): Payer: Self-pay | Admitting: General Surgery

## 2013-10-23 DIAGNOSIS — D3610 Benign neoplasm of peripheral nerves and autonomic nervous system, unspecified: Secondary | ICD-10-CM

## 2013-10-23 NOTE — Telephone Encounter (Signed)
Close encounter 

## 2013-10-23 NOTE — Telephone Encounter (Signed)
Pt is scheduled for yearly f/u CT scan on 11/27/13 at 9:30 at [facility].  She has a f/u appt with Dr. Zella Richer on 11/29/13.

## 2013-11-24 ENCOUNTER — Other Ambulatory Visit: Payer: Self-pay | Admitting: Otolaryngology

## 2013-11-24 LAB — BUN: BUN: 15 mg/dL (ref 6–23)

## 2013-11-24 LAB — CREATININE, SERUM: Creat: 0.7 mg/dL (ref 0.50–1.10)

## 2013-11-27 ENCOUNTER — Ambulatory Visit
Admission: RE | Admit: 2013-11-27 | Discharge: 2013-11-27 | Disposition: A | Discharge status: Home / Self Care | Payer: BC Managed Care – PPO | Source: Ambulatory Visit | Attending: General Surgery | Admitting: General Surgery

## 2013-11-27 DIAGNOSIS — D3610 Benign neoplasm of peripheral nerves and autonomic nervous system, unspecified: Secondary | ICD-10-CM

## 2013-11-27 MED ORDER — IOHEXOL 300 MG/ML  SOLN
100.0000 mL | Freq: Once | INTRAMUSCULAR | Status: AC | PRN
  Administered 2013-11-27: 100 mL via INTRAVENOUS

## 2013-11-29 ENCOUNTER — Ambulatory Visit (INDEPENDENT_AMBULATORY_CARE_PROVIDER_SITE_OTHER): Payer: BC Managed Care – PPO | Admitting: General Surgery

## 2013-11-29 ENCOUNTER — Encounter (INDEPENDENT_AMBULATORY_CARE_PROVIDER_SITE_OTHER): Payer: Self-pay | Admitting: General Surgery

## 2013-11-29 VITALS — BP 124/72 | HR 76 | Temp 98.0°F | Ht 66.0 in | Wt 172.0 lb

## 2013-11-29 DIAGNOSIS — D3610 Benign neoplasm of peripheral nerves and autonomic nervous system, unspecified: Secondary | ICD-10-CM

## 2013-11-29 DIAGNOSIS — D219 Benign neoplasm of connective and other soft tissue, unspecified: Secondary | ICD-10-CM

## 2013-11-29 NOTE — Patient Instructions (Signed)
There has been no change in size. Will repeat CT scan and examination in one year.

## 2013-11-29 NOTE — Progress Notes (Signed)
Patient ID: Emily Richard, female   DOB: Sep 06, 1950, 63 y.o.   MRN: [id number]  Chief Complaint  Patient presents with  . Follow-up    HPI Emily Richard is a 63 y.o. female.   HPI  She is here for another followup visit of her left retroperitoneal benign schwannoma (biopsy proven).  She remains asymptomatic from this.  Follow up CT again shows no change in size of Schwannoma.  Past Medical History  Diagnosis Date  . Poor circulation   . Shortness of breath   . Family history of breast cancer     mother, aunt  . Hyperlipidemia   . Schwannoma   . GERD (gastroesophageal reflux disease)   . Osteoporosis     Past Surgical History  Procedure Laterality Date  . Tumor biopsy  09/2010    Family History  Problem Relation Age of Onset  . Other Mother     left mastectomy 1998  . Osteoporosis Mother   . Cancer Mother     breast  . Cancer Maternal Aunt     breast    Social History History  Substance Use Topics  . Smoking status: Never Smoker   . Smokeless tobacco: Never Used  . Alcohol Use: No    Allergies  Allergen Reactions  . Penicillins Hives and Itching    All over body.    Current Outpatient Prescriptions  Medication Sig Dispense Refill  . aspirin 81 MG tablet Take 81 mg by mouth daily.        Marland Kitchen atorvastatin (LIPITOR) 10 MG tablet 40 mg daily.       . Calcium Carbonate-Vitamin D (CALTRATE 600+D PO) Take by mouth.        . clobetasol (TEMOVATE) 0.05 % GEL Apply topically 2 (two) times daily.      . Clobetasol Prop Emollient Base 0.05 % emollient cream Apply topically 3 (three) times a week.      Marland Kitchen ESTRACE VAGINAL 0.1 MG/GM vaginal cream       . fish oil-omega-3 fatty acids 1000 MG capsule Take 2 g by mouth daily.        . fluticasone (FLONASE) 50 MCG/ACT nasal spray       . medroxyPROGESTERone (PROVERA) 2.5 MG tablet       . multivitamin-iron-minerals-folic acid (CENTRUM) chewable tablet Chew 1 tablet by mouth daily.        Marland Kitchen omeprazole (PRILOSEC) 20 MG  capsule       . OSPHENA 60 MG TABS       . raloxifene (EVISTA) 60 MG tablet Take 60 mg by mouth daily.         No current facility-administered medications for this visit.    Review of Systems Review of Systems  Constitutional: Negative.   Gastrointestinal: Negative for abdominal pain.  Musculoskeletal: Positive for arthralgias (left hip which is better).  Neurological: Negative for weakness.    Blood pressure 124/72, pulse 76, temperature 98 F (36.7 C), height 5\' 6"  (1.676 m), weight 172 lb (78.019 kg).  Physical Exam Physical Exam  Constitutional: She appears well-developed and well-nourished. No distress.  Abdominal: Soft. She exhibits no mass. There is no tenderness.    Data Reviewed CT scan-schwannoma size  unchanged.  Assessment    Left retroperitoneal schwannoma unchanged.    Plan    Repeat CT and follow-up visit in 12 months.       Jaishawn Witzke J 11/29/2013, 10:44 AM

## 2014-01-18 HISTORY — PX: COLONOSCOPY: SHX174

## 2014-06-08 LAB — HM PAP SMEAR: HM PAP: NEGATIVE

## 2014-06-21 ENCOUNTER — Encounter
Admit: 2014-06-21 | Disposition: A | Discharge status: Home / Self Care | Payer: Self-pay | Attending: Obstetrics and Gynecology | Admitting: Obstetrics and Gynecology

## 2014-07-19 ENCOUNTER — Ambulatory Visit
Admit: 2014-07-19 | Disposition: A | Discharge status: Home / Self Care | Payer: Self-pay | Attending: Family Medicine | Admitting: Family Medicine

## 2014-07-20 ENCOUNTER — Encounter
Admit: 2014-07-20 | Disposition: A | Discharge status: Home / Self Care | Payer: Self-pay | Attending: Obstetrics and Gynecology | Admitting: Obstetrics and Gynecology

## 2014-08-21 ENCOUNTER — Encounter: Payer: Self-pay | Admitting: Physical Therapy

## 2014-08-28 ENCOUNTER — Encounter: Payer: Self-pay | Admitting: Physical Therapy

## 2014-09-04 ENCOUNTER — Encounter: Payer: Self-pay | Admitting: Physical Therapy

## 2014-09-11 ENCOUNTER — Encounter: Payer: Self-pay | Admitting: Physical Therapy

## 2014-09-18 ENCOUNTER — Encounter: Payer: Self-pay | Admitting: Physical Therapy

## 2014-12-04 ENCOUNTER — Other Ambulatory Visit: Payer: Self-pay | Admitting: General Surgery

## 2014-12-04 ENCOUNTER — Other Ambulatory Visit: Payer: Self-pay

## 2014-12-04 DIAGNOSIS — D3610 Benign neoplasm of peripheral nerves and autonomic nervous system, unspecified: Secondary | ICD-10-CM

## 2014-12-06 ENCOUNTER — Other Ambulatory Visit: Payer: Self-pay

## 2014-12-06 DIAGNOSIS — D3610 Benign neoplasm of peripheral nerves and autonomic nervous system, unspecified: Secondary | ICD-10-CM

## 2014-12-06 NOTE — Addendum Note (Signed)
Addended by: Odis Hollingshead on: 12/06/2014 08:01 AM   Modules accepted: Orders

## 2014-12-07 ENCOUNTER — Other Ambulatory Visit: Payer: Self-pay | Admitting: Surgery

## 2014-12-07 ENCOUNTER — Other Ambulatory Visit: Payer: Self-pay

## 2014-12-07 ENCOUNTER — Other Ambulatory Visit: Payer: Self-pay | Admitting: General Surgery

## 2014-12-07 DIAGNOSIS — D3610 Benign neoplasm of peripheral nerves and autonomic nervous system, unspecified: Secondary | ICD-10-CM

## 2014-12-07 DIAGNOSIS — I714 Abdominal aortic aneurysm, without rupture, unspecified: Secondary | ICD-10-CM

## 2014-12-10 ENCOUNTER — Ambulatory Visit
Admission: RE | Admit: 2014-12-10 | Discharge: 2014-12-10 | Disposition: A | Discharge status: Home / Self Care | Payer: BLUE CROSS/BLUE SHIELD | Source: Ambulatory Visit | Attending: Surgery | Admitting: Surgery

## 2014-12-10 ENCOUNTER — Other Ambulatory Visit: Payer: BLUE CROSS/BLUE SHIELD

## 2014-12-10 MED ORDER — IOPAMIDOL (ISOVUE-300) INJECTION 61%
100.0000 mL | Freq: Once | INTRAVENOUS | Status: AC | PRN
  Administered 2014-12-10: 100 mL via INTRAVENOUS

## 2015-11-25 ENCOUNTER — Other Ambulatory Visit: Payer: Self-pay | Admitting: General Surgery

## 2015-11-25 ENCOUNTER — Ambulatory Visit
Admission: RE | Admit: 2015-11-25 | Discharge: 2015-11-25 | Disposition: A | Discharge status: Home / Self Care | Payer: Self-pay | Source: Ambulatory Visit | Attending: General Surgery | Admitting: General Surgery

## 2015-11-25 DIAGNOSIS — D3610 Benign neoplasm of peripheral nerves and autonomic nervous system, unspecified: Secondary | ICD-10-CM

## 2015-11-25 MED ORDER — IOPAMIDOL (ISOVUE-300) INJECTION 61%
100.0000 mL | Freq: Once | INTRAVENOUS | Status: AC | PRN
  Administered 2015-11-25: 100 mL via INTRAVENOUS

## 2016-03-26 ENCOUNTER — Other Ambulatory Visit: Payer: Self-pay | Admitting: Family Medicine

## 2016-03-26 DIAGNOSIS — Z1231 Encounter for screening mammogram for malignant neoplasm of breast: Secondary | ICD-10-CM

## 2016-05-06 ENCOUNTER — Ambulatory Visit: Payer: 59

## 2016-06-03 ENCOUNTER — Ambulatory Visit
Admission: RE | Admit: 2016-06-03 | Discharge: 2016-06-03 | Disposition: A | Discharge status: Home / Self Care | Payer: 59 | Source: Ambulatory Visit | Attending: Family Medicine | Admitting: Family Medicine

## 2016-06-03 DIAGNOSIS — Z1231 Encounter for screening mammogram for malignant neoplasm of breast: Secondary | ICD-10-CM | POA: Insufficient documentation

## 2016-08-06 ENCOUNTER — Ambulatory Visit (INDEPENDENT_AMBULATORY_CARE_PROVIDER_SITE_OTHER): Payer: 59 | Admitting: Obstetrics and Gynecology

## 2016-08-06 ENCOUNTER — Encounter: Payer: Self-pay | Admitting: Obstetrics and Gynecology

## 2016-08-06 VITALS — BP 128/66 | HR 72 | Ht 67.0 in | Wt 179.0 lb

## 2016-08-06 DIAGNOSIS — N952 Postmenopausal atrophic vaginitis: Secondary | ICD-10-CM | POA: Diagnosis not present

## 2016-08-06 DIAGNOSIS — Z01419 Encounter for gynecological examination (general) (routine) without abnormal findings: Secondary | ICD-10-CM | POA: Diagnosis not present

## 2016-08-06 DIAGNOSIS — L280 Lichen simplex chronicus: Secondary | ICD-10-CM

## 2016-08-06 DIAGNOSIS — N898 Other specified noninflammatory disorders of vagina: Secondary | ICD-10-CM

## 2016-08-06 MED ORDER — CLOBETASOL PROP EMOLLIENT BASE 0.05 % EX CREA: TOPICAL_CREAM | CUTANEOUS | 1 refills | Status: DC

## 2016-08-06 NOTE — Progress Notes (Signed)
Chief Complaint  Patient presents with  . Gynecologic Exam  . Vaginitis    raw feeling  . painful intercourse    HPI:      [name] is a 66 y.o. [phone] who LMP was No LMP recorded. Patient is postmenopausal., presents today for her annual examination.  Her menses are absent due to menopause.  She does not have intermenstrual bleeding.  She does not have vasomotor sx.   Sex activity: not sexually active. She does have vaginal dryness. She has done PT for dyspareunia and vag ERT wtihout relief. It's just too painful. Most likely related to scar tissue from post delivery stitches.  She complains of recent raw area RT introitus for the past wk. She had a neg urine eval with PCP but urine burns the area. No increased d/c, odor.  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   Last Pap: May 29, 2014  Results were: no abnormalities /neg HPV DNA.    Last mammogram: June 03, 2016  Results were: normal--routine follow-up in 12 months There is a FH of breast cancer in her mom and mat aunt, genetic testing not indicated. There is no FH of ovarian cancer. The patient does do self-breast exams.  Colonoscopy: colonoscopy 3 years ago without abnormalities. Due in 7 yrs now. DEXA: osteoporosis by PCP, declines tx  Tobacco use: The patient denies current or previous tobacco use. Alcohol use: none Exercise: moderately active  She does get adequate calcium and Vitamin D in her diet.   Past Medical History:  Diagnosis Date  . Bursitis   . Family history of breast cancer    mother, aunt  . GERD (gastroesophageal reflux disease)   . Hyperlipidemia   . Osteoporosis   . Poor circulation   . Schwannoma   . Shortness of breath     Past Surgical History:  Procedure Laterality Date  . BREAST EXCISIONAL BIOPSY Right 2003   NEG  . COLONOSCOPY  01/2014  . tumor biopsy  09/2010  . VULVA /PERINEUM BIOPSY      Family History  Problem Relation  Age of Onset  . Other Mother     left mastectomy 1998  . Osteoporosis Mother   . Breast cancer Mother 5  . Uterine cancer Mother 60  . Cancer Maternal Aunt     breast  . Breast cancer Maternal Aunt     Social History   Social History  . Marital status: Married    Spouse name: N/A  . Number of children: N/A  . Years of education: N/A   Occupational History  . Not on file.   Social History Main Topics  . Smoking status: Never Smoker  . Smokeless tobacco: Never Used  . Alcohol use No  . Drug use: No  . Sexual activity: No   Other Topics Concern  . Not on file   Social History Narrative  . No narrative on file     Current Outpatient Prescriptions:  .  aspirin 81 MG tablet, Take 81 mg by mouth daily.  , Disp: , Rfl:  .  atorvastatin (LIPITOR) 10 MG tablet, 40 mg daily. , Disp: , Rfl:  .  Cholecalciferol (VITAMIN D-1000 MAX ST) 1000 units tablet, Take by mouth., Disp: , Rfl:  .  Clobetasol Prop Emollient Base 0.05 % emollient cream, Apply topically 3 (three) times a week., Disp: , Rfl:  .  denosumab (PROLIA) 60 MG/ML  SOLN injection, Inject into the skin., Disp: , Rfl:  .  multivitamin-iron-minerals-folic acid (CENTRUM) chewable tablet, Chew 1 tablet by mouth daily.  , Disp: , Rfl:  .  omeprazole (PRILOSEC) 20 MG capsule, , Disp: , Rfl:    ROS:  Review of Systems  Constitutional: Negative for fever, malaise/fatigue and weight loss.  HENT: Negative for congestion, ear pain and sinus pain.   Respiratory: Negative for cough, shortness of breath and wheezing.   Cardiovascular: Negative for chest pain, orthopnea and leg swelling.  Gastrointestinal: Negative for constipation, diarrhea, nausea and vomiting.  Genitourinary: Positive for dysuria. Negative for frequency, hematuria and urgency.       Breast ROS: negative   Musculoskeletal: Negative for back pain, joint pain and myalgias.  Skin: Positive for rash. Negative for itching.  Neurological: Negative for dizziness,  tingling, focal weakness and headaches.  Endo/Heme/Allergies: Negative for environmental allergies. Does not bruise/bleed easily.  Psychiatric/Behavioral: Negative for depression and suicidal ideas. The patient is not nervous/anxious and does not have insomnia.     Objective: BP 128/66 (BP Location: Left Arm, Patient Position: Sitting, Cuff Size: Normal)   Pulse 72   Ht 5\' 7"  (1.702 m)   Wt 179 lb (81.2 kg)   BMI 28.04 kg/m    Physical Exam  Constitutional: She is oriented to person, place, and time. She appears well-developed and well-nourished.  Genitourinary: Vagina normal and uterus normal.  There is tenderness on the right labia.    No erythema or tenderness in the vagina. No vaginal discharge found. Right adnexum does not display mass and does not display tenderness. Left adnexum does not display mass and does not display tenderness. Cervix does not exhibit motion tenderness or polyp. Uterus is not enlarged or tender.  Genitourinary Comments: SLIGHTLY DARKER IN COLOR AND TENDER; NO LESIONS, ERYTHEMA  Neck: Normal range of motion. No thyromegaly present.  Cardiovascular: Normal rate, regular rhythm and normal heart sounds.   No murmur heard. Pulmonary/Chest: Effort normal and breath sounds normal. Right breast exhibits no mass, no nipple discharge, no skin change and no tenderness. Left breast exhibits no mass, no nipple discharge, no skin change and no tenderness.  Abdominal: Soft. There is no tenderness. There is no guarding.  Musculoskeletal: Normal range of motion.  Neurological: She is alert and oriented to person, place, and time. No cranial nerve deficit.  Psychiatric: She has a normal mood and affect. Her behavior is normal.  Vitals reviewed.   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 Rx RF.  Vaginal irritation - Tender area on exam but not impressive. Question  related to atrophy. 1 sample premarin crm given to pt to use ext nightly for 1-2 wks. F/u if sx persist.   Vaginal atrophy            GYN counsel menopause, osteoporosis, adequate intake of calcium and vitamin D, diet and exercise     F/U  Return in about 1 year (around 08/06/2017).  Alicia B. Copland, PA-C 08/06/2016 10:47 AM

## 2016-08-25 ENCOUNTER — Ambulatory Visit: Payer: 59 | Admitting: Obstetrics and Gynecology

## 2016-08-26 ENCOUNTER — Encounter: Payer: Self-pay | Admitting: Obstetrics and Gynecology

## 2016-08-26 ENCOUNTER — Ambulatory Visit (INDEPENDENT_AMBULATORY_CARE_PROVIDER_SITE_OTHER): Payer: 59 | Admitting: Obstetrics and Gynecology

## 2016-08-26 VITALS — BP 124/74 | Ht 66.0 in | Wt 180.0 lb

## 2016-08-26 DIAGNOSIS — N898 Other specified noninflammatory disorders of vagina: Secondary | ICD-10-CM

## 2016-08-26 DIAGNOSIS — N764 Abscess of vulva: Secondary | ICD-10-CM | POA: Diagnosis not present

## 2016-08-26 NOTE — Progress Notes (Signed)
Chief Complaint  Patient presents with  . Vaginal Abcess    HPI:      [name] is a 66 y.o. G2P2002 who LMP was No LMP recorded. Patient is postmenopausal., presents today for a painful abscess LT vulvar area. Sx started about a wk ago. The area became very tender, swollen, and hurt to move in certain positions. It started bleeding yesterday after using warm compresses. It is not as tender or swollen today. She denies any fevers.   The area of vaginal irritation at RT introitus from 4/18 annual has improved with vag ERT cream.    Patient Active Problem List   Diagnosis Date Noted  . Schwannoma 11/21/2010    Family History  Problem Relation Age of Onset  . Other Mother     left mastectomy 1998  . Osteoporosis Mother   . Breast cancer Mother 67  . Uterine cancer Mother 49  . Breast cancer Maternal Aunt 57    Social History   Social History  . Marital status: Married    Spouse name: N/A  . Number of children: N/A  . Years of education: N/A   Occupational History  . Not on file.   Social History Main Topics  . Smoking status: Never Smoker  . Smokeless tobacco: Never Used  . Alcohol use No  . Drug use: No  . Sexual activity: No   Other Topics Concern  . Not on file   Social History Narrative  . No narrative on file     Current Outpatient Prescriptions:  .  aspirin 81 MG tablet, Take 81 mg by mouth daily.  , Disp: , Rfl:  .  atorvastatin (LIPITOR) 10 MG tablet, 40 mg daily. , Disp: , Rfl:  .  Cholecalciferol (VITAMIN D-1000 MAX ST) 1000 units tablet, Take by mouth., Disp: , Rfl:  .  Clobetasol Prop Emollient Base 0.05 % emollient cream, AAA 3-4 times weekly as maintenance, Disp: 30 g, Rfl: 1 .  denosumab (PROLIA) 60 MG/ML SOLN injection, Inject into the skin., Disp: , Rfl:  .  multivitamin-iron-minerals-folic acid (CENTRUM) chewable tablet, Chew 1 tablet by mouth daily.  , Disp: , Rfl:  .  omeprazole (PRILOSEC) 20 MG capsule, , Disp: , Rfl:    Review of Systems  Constitutional: Negative for fever.  Gastrointestinal: Negative for blood in stool, constipation, diarrhea, nausea and vomiting.  Genitourinary: Positive for genital sores and vaginal pain. Negative for dyspareunia, dysuria, flank pain, frequency, hematuria, urgency, vaginal bleeding and vaginal discharge.  Musculoskeletal: Negative for back pain.  Skin: Negative for rash.     OBJECTIVE:   Vitals:  BP 124/74   Ht 5\' 6"  (1.676 m)   Wt 180 lb (81.6 kg)   BMI 29.05 kg/m   Physical Exam  Constitutional: She is oriented to person, place, and time and well-developed, well-nourished, and in no distress. Vital signs are normal.  Genitourinary: Vulva exhibits erythema, lesion and tenderness. Vulva exhibits no exudate and no rash.  Genitourinary Comments: LT PERIANAL/BUTTOCKS AREA WITH ~3 CM, FIRM, INDURATED, SLIGHTLY TENDER, ERYTHEMATOUS, RESOLVING ABSCESS; 2 AREAS OF D/C AT THE CENTER  Neurological: She is oriented to person, place, and time.  Vitals reviewed.   Assessment/Plan: Vulvar abscess - Resolving. Cont sitz baths/warm compresses. If sx worsen, will do abx, but no need for them today. F/u prn.  Vaginal lesion     Return if symptoms worsen or fail to improve.  Alicia B. Copland, PA-C 08/26/2016 9:50 AM

## 2016-09-23 ENCOUNTER — Other Ambulatory Visit: Payer: Self-pay | Admitting: Obstetrics and Gynecology

## 2016-09-23 ENCOUNTER — Telehealth: Payer: Self-pay

## 2016-09-23 MED ORDER — DOXYCYCLINE HYCLATE 100 MG PO CAPS: 100.0000 mg | ORAL_CAPSULE | Freq: Two times a day (BID) | ORAL | 0 refills | Status: DC

## 2016-09-23 NOTE — Telephone Encounter (Signed)
Rx doxy eRxd. Start abx/warm compresses. F/u prn. RN to notify pt.

## 2016-09-23 NOTE — Telephone Encounter (Signed)
LMTC

## 2016-09-23 NOTE — Telephone Encounter (Signed)
Pt calling stating abscess has come back.  What to do - antibx or come in?  [phone]

## 2016-09-23 NOTE — Telephone Encounter (Signed)
Pt aware.

## 2016-10-25 ENCOUNTER — Encounter: Payer: Self-pay | Admitting: Emergency Medicine

## 2016-10-25 DIAGNOSIS — Z79899 Other long term (current) drug therapy: Secondary | ICD-10-CM | POA: Insufficient documentation

## 2016-10-25 DIAGNOSIS — E869 Volume depletion, unspecified: Secondary | ICD-10-CM | POA: Diagnosis not present

## 2016-10-25 DIAGNOSIS — Z7982 Long term (current) use of aspirin: Secondary | ICD-10-CM | POA: Diagnosis not present

## 2016-10-25 DIAGNOSIS — R197 Diarrhea, unspecified: Secondary | ICD-10-CM | POA: Diagnosis present

## 2016-10-25 NOTE — ED Triage Notes (Signed)
Pt presents to ED with frequent diarrhea and weakness for the past 2 days. Pt states she has no abd pain but just feels dizzy and weak. Pt talkative and ambulatory to triage with a steady gait. No distress noted at this time.

## 2016-10-26 ENCOUNTER — Emergency Department
Admission: EM | Admit: 2016-10-26 | Discharge: 2016-10-26 | Disposition: A | Discharge status: Home / Self Care | Payer: 59 | Attending: Emergency Medicine | Admitting: Emergency Medicine

## 2016-10-26 DIAGNOSIS — R197 Diarrhea, unspecified: Secondary | ICD-10-CM

## 2016-10-26 DIAGNOSIS — E869 Volume depletion, unspecified: Secondary | ICD-10-CM

## 2016-10-26 LAB — BASIC METABOLIC PANEL
Anion gap: 10 (ref 5–15)
BUN: 10 mg/dL (ref 6–20)
CHLORIDE: 102 mmol/L (ref 101–111)
CO2: 26 mmol/L (ref 22–32)
CREATININE: 0.67 mg/dL (ref 0.44–1.00)
Calcium: 9.3 mg/dL (ref 8.9–10.3)
GFR calc Af Amer: >60 mL/min (ref >60)
GFR calc non Af Amer: >60 mL/min (ref >60)
GLUCOSE: 119 mg/dL — AB (ref 65–99)
POTASSIUM: 3.9 mmol/L (ref 3.5–5.1)
SODIUM: 138 mmol/L (ref 135–145)

## 2016-10-26 LAB — URINALYSIS, COMPLETE (UACMP) WITH MICROSCOPIC
BACTERIA UA: NONE SEEN
BILIRUBIN URINE: NEGATIVE
Glucose, UA: NEGATIVE mg/dL
Hgb urine dipstick: NEGATIVE
KETONES UR: 20 mg/dL — AB
LEUKOCYTES UA: NEGATIVE
Nitrite: NEGATIVE
PH: 5 (ref 5.0–8.0)
Protein, ur: 30 mg/dL — AB
SPECIFIC GRAVITY, URINE: 1.025 (ref 1.005–1.030)

## 2016-10-26 LAB — CBC
HCT: 42.1 % (ref 35.0–47.0)
Hemoglobin: 14.3 g/dL (ref 12.0–16.0)
MCH: 31.7 pg (ref 26.0–34.0)
MCHC: 34 g/dL (ref 32.0–36.0)
MCV: 93.3 fL (ref 80.0–100.0)
PLATELETS: 204 10*3/uL (ref 150–440)
RBC: 4.51 MIL/uL (ref 3.80–5.20)
RDW: 13.6 % (ref 11.5–14.5)
WBC: 8.4 10*3/uL (ref 3.6–11.0)

## 2016-10-26 LAB — GLUCOSE, CAPILLARY: GLUCOSE-CAPILLARY: 110 mg/dL — AB (ref 65–99)

## 2016-10-26 LAB — TROPONIN I

## 2016-10-26 MED ORDER — ONDANSETRON 4 MG PO TBDP
4.0000 mg | ORAL_TABLET | Freq: Once | ORAL | Status: AC
  Administered 2016-10-26: 4 mg via ORAL
  Filled 2016-10-26: qty 1

## 2016-10-26 MED ORDER — SODIUM CHLORIDE 0.9 % IV BOLUS (SEPSIS)
1000.0000 mL | INTRAVENOUS | Status: AC
  Administered 2016-10-26: 1000 mL via INTRAVENOUS

## 2016-10-26 MED ORDER — ONDANSETRON 4 MG PO TBDP: ORAL_TABLET | ORAL | 0 refills | Status: DC

## 2016-10-26 NOTE — ED Provider Notes (Signed)
[facility] Emergency Department Provider Note  ____________________________________________   First MD Initiated Contact with Patient 10/26/16 0411     (approximate)  I have reviewed the triage vital signs and the nursing notes.   HISTORY  Chief Complaint Diarrhea and Weakness    HPI Emily Richard is a 66 y.o. female with no significant PMH who presents for evaluation of 2-3 days of severe diarrhea (15+ episodes daily) with intermittent mild nausea and generalized weakness.  Reports she had a GI bug several weeks ago with vomiting and diarrhea and it completely resolved.  She also had a urinary tract infection at the time she took a 5 day course of Cipro.  She was "normal" for at least a week and then to 3 days ago developed watery diarrhea again.  This time she has had no vomiting but occasionally has had some nausea.  She is try to stay hydrated with plenty of fluids including Gatorade but feels like she is increasingly weak over the last couple of days.  She has had some "shaking chills" but no fever of which she is aware.  She denies abdominal pain, chest pain, shortness of breath.  Nothing in particular makes the patient's symptoms better nor worse.  She does not see any blood in her stool.   Past Medical History:  Diagnosis Date  . Bursitis   . Family history of breast cancer    mother, aunt  . GERD (gastroesophageal reflux disease)   . Hyperlipidemia   . Osteoporosis   . Poor circulation   . Schwannoma   . Shortness of breath     Patient Active Problem List   Diagnosis Date Noted  . Schwannoma 11/21/2010    Past Surgical History:  Procedure Laterality Date  . BREAST EXCISIONAL BIOPSY Right 2003   NEG  . COLONOSCOPY  01/2014  . tumor biopsy  09/2010  . VULVA /PERINEUM BIOPSY      Prior to Admission medications   Medication Sig Start Date End Date Taking? Authorizing Provider  aspirin 81 MG tablet Take 81 mg by mouth daily.       [provider]  atorvastatin (LIPITOR) 10 MG tablet 40 mg daily.  03/08/11   [provider]  Cholecalciferol (VITAMIN D-1000 MAX ST) 1000 units tablet Take by mouth.    [provider]  Clobetasol Prop Emollient Base 0.05 % emollient cream AAA 3-4 times weekly as maintenance 10/14/01   Copland, Alicia B, PA-C  denosumab (PROLIA) 60 MG/ML SOLN injection Inject into the skin.    [provider]  doxycycline (VIBRAMYCIN) 100 MG capsule Take 1 capsule (100 mg total) by mouth 2 (two) times daily. 5/0/09   Copland, Deirdre Evener, PA-C  multivitamin-iron-minerals-folic acid (CENTRUM) chewable tablet Chew 1 tablet by mouth daily.      [provider]  omeprazole (PRILOSEC) 20 MG capsule  11/17/12   [provider]  ondansetron (ZOFRAN ODT) 4 MG disintegrating tablet Allow 1-2 tablets to dissolve in your mouth every 8 hours as needed for nausea/vomiting 10/26/16   Hinda Kehr, MD    Allergies Penicillins  Family History  Problem Relation Age of Onset  . Other Mother        left mastectomy 1998  . Osteoporosis Mother   . Breast cancer Mother 68  . Uterine cancer Mother 56  . Breast cancer Maternal Aunt 57    Social History Social History  Substance Use Topics  . Smoking status: Never Smoker  .  Smokeless tobacco: Never Used  . Alcohol use No    Review of Systems Constitutional: No fever.  "Shaking chills" last night.  Generalized weakness and fatigue Eyes: No visual changes. ENT: No sore throat. Cardiovascular: Denies chest pain. Respiratory: Denies shortness of breath. Gastrointestinal: No abdominal pain.  Nausea, no vomiting.  Copious diarrhea x 2-3 days.  No blood in stool. Genitourinary: Negative for dysuria. Musculoskeletal: Negative for neck pain.  Negative for back pain. Integumentary: Negative for rash. Neurological: Negative for headaches, focal weakness or numbness.      ____________________________________________   PHYSICAL EXAM:  VITAL SIGNS: ED Triage Vitals  Enc Vitals Group     BP 10/25/16 2328 135/71     Pulse Rate 10/26/16 0322 84     Resp 10/25/16 2328 16     Temp 10/25/16 2328 99.1 F (37.3 C)     Temp Source 10/25/16 2328 Oral     SpO2 10/25/16 2328 99 %     Weight 10/25/16 2328 77.6 kg (171 lb)     Height 10/25/16 2328 1.676 m (5\' 6" )     Head Circumference --      Peak Flow --      Pain Score --      Pain Loc --      Pain Edu? --      Excl. in [place]? --     Constitutional: Alert and oriented. Well appearing and in no acute distress. Eyes: Conjunctivae are normal.  Head: Atraumatic. Nose: No congestion/rhinnorhea. Mouth/Throat: Mucous membranes are moist. Neck: No stridor.  No meningeal signs.   Cardiovascular: Normal rate, regular rhythm. Good peripheral circulation. Grossly normal heart sounds. Respiratory: Normal respiratory effort.  No retractions. Lungs CTAB. Gastrointestinal: Soft and nontender. No distention.  Musculoskeletal: No lower extremity tenderness nor edema. No gross deformities of extremities. Neurologic:  Normal speech and language. No gross focal neurologic deficits are appreciated.  Skin:  Skin is warm, dry and intact. No rash noted. Psychiatric: Mood and affect are normal. Speech and behavior are normal.  ____________________________________________   LABS (all labs ordered are listed, but only abnormal results are displayed)  Labs Reviewed  BASIC METABOLIC PANEL - Abnormal; Notable for the following:       Result Value   Glucose, Bld 119 (*)    All other components within normal limits  URINALYSIS, COMPLETE (UACMP) WITH MICROSCOPIC - Abnormal; Notable for the following:    Color, Urine YELLOW (*)    APPearance CLEAR (*)    Ketones, ur 20 (*)    Protein, ur 30 (*)    Squamous Epithelial / LPF 0-5 (*)    All other components within normal limits  GLUCOSE, CAPILLARY - Abnormal; Notable for the  following:    Glucose-Capillary 110 (*)    All other components within normal limits  CBC  TROPONIN I  CBG MONITORING, ED   ____________________________________________  EKG  ED ECG REPORT I, Mead Slane, the attending physician, personally viewed and interpreted this ECG.  Date: 10/26/2016 EKG Time: 00:02 Rate: 91 Rhythm: normal sinus rhythm QRS Axis: normal Intervals: normal ST/T Wave abnormalities: Non-specific ST segment / T-wave changes, but no evidence of acute ischemia. Narrative Interpretation: unremarkable  ____________________________________________  RADIOLOGY   No results found.  ____________________________________________   PROCEDURES  Critical Care performed: No   Procedure(s) performed:   Procedures   ____________________________________________   INITIAL IMPRESSION / ASSESSMENT AND PLAN / ED COURSE  Pertinent labs & imaging results that were available during my care  of the patient were reviewed by me and considered in my medical decision making (see chart for details).  The patient is well-appearing and in no acute distress with normal vital signs.  In spite of her copious diarrhea, her lab results are all reassuring other than some ketones in her urine consistent with mild volume depletion.  We had a long discussion about empiric antibiotics versus stool studies to determine if she has a pathogenic cause of the diarrhea.  She is at low risk for C. difficile as she took some Cipro but that was almost 2 weeks ago and had been asymptomatic since then.  She is not systemically ill.  She does not believe she can provide a stool sample at this time.  We agreed upon a liter of fluids to help replete some volume depletion and some Zofran and outpatient follow-up.  I gave my usual and customary return precautions.         ____________________________________________  FINAL CLINICAL IMPRESSION(S) / ED DIAGNOSES  Final diagnoses:  Diarrhea of  presumed infectious origin  Volume depletion     MEDICATIONS GIVEN DURING THIS VISIT:  Medications  ondansetron (ZOFRAN-ODT) disintegrating tablet 4 mg (4 mg Oral Given 10/26/16 0039)  sodium chloride 0.9 % bolus 1,000 mL (1,000 mLs Intravenous New Bag/Given 10/26/16 0431)     NEW OUTPATIENT MEDICATIONS STARTED DURING THIS VISIT:  New Prescriptions   ONDANSETRON (ZOFRAN ODT) 4 MG DISINTEGRATING TABLET    Allow 1-2 tablets to dissolve in your mouth every 8 hours as needed for nausea/vomiting    Modified Medications   No medications on file    Discontinued Medications   No medications on file     Note:  This document was prepared using Dragon voice recognition software and may include unintentional dictation errors.    Hinda Kehr, MD 10/26/16 [phone]

## 2016-10-26 NOTE — ED Notes (Signed)
ED Provider at bedside. 

## 2016-10-26 NOTE — ED Notes (Signed)
Pt states she has had diarrhea x 2 days and "went 20 times in the last 24h" with intermittent nausea and NO vomiting.  Pt states she feels weak constantly and "goes from couch to chair to bathroom"  Pt states she has not had any near misses with falls or feeling like she was going to pass out.  Pt AOx4.

## 2016-10-26 NOTE — Discharge Instructions (Signed)

## 2016-12-28 ENCOUNTER — Other Ambulatory Visit: Payer: Self-pay | Admitting: General Surgery

## 2016-12-28 DIAGNOSIS — D3610 Benign neoplasm of peripheral nerves and autonomic nervous system, unspecified: Secondary | ICD-10-CM

## 2016-12-30 ENCOUNTER — Ambulatory Visit
Admission: RE | Admit: 2016-12-30 | Discharge: 2016-12-30 | Disposition: A | Discharge status: Home / Self Care | Payer: 59 | Source: Ambulatory Visit | Attending: General Surgery | Admitting: General Surgery

## 2016-12-30 DIAGNOSIS — D3610 Benign neoplasm of peripheral nerves and autonomic nervous system, unspecified: Secondary | ICD-10-CM

## 2016-12-30 MED ORDER — IOPAMIDOL (ISOVUE-300) INJECTION 61%
100.0000 mL | Freq: Once | INTRAVENOUS | Status: AC | PRN
  Administered 2016-12-30: 100 mL via INTRAVENOUS

## 2017-03-31 ENCOUNTER — Other Ambulatory Visit: Payer: Self-pay | Admitting: Family Medicine

## 2017-03-31 DIAGNOSIS — Z1231 Encounter for screening mammogram for malignant neoplasm of breast: Secondary | ICD-10-CM

## 2017-05-05 ENCOUNTER — Encounter: Payer: Self-pay | Admitting: Sports Medicine

## 2017-05-05 ENCOUNTER — Other Ambulatory Visit: Payer: Self-pay | Admitting: Sports Medicine

## 2017-05-05 ENCOUNTER — Ambulatory Visit (INDEPENDENT_AMBULATORY_CARE_PROVIDER_SITE_OTHER): Payer: 59

## 2017-05-05 ENCOUNTER — Ambulatory Visit: Payer: 59 | Admitting: Podiatry

## 2017-05-05 ENCOUNTER — Ambulatory Visit: Payer: 59 | Admitting: Sports Medicine

## 2017-05-05 DIAGNOSIS — M7751 Other enthesopathy of right foot: Secondary | ICD-10-CM

## 2017-05-05 DIAGNOSIS — M2580 Other specified joint disorders, unspecified joint: Secondary | ICD-10-CM | POA: Diagnosis not present

## 2017-05-05 DIAGNOSIS — M778 Other enthesopathies, not elsewhere classified: Secondary | ICD-10-CM

## 2017-05-05 DIAGNOSIS — M2011 Hallux valgus (acquired), right foot: Secondary | ICD-10-CM

## 2017-05-05 DIAGNOSIS — D3610 Benign neoplasm of peripheral nerves and autonomic nervous system, unspecified: Secondary | ICD-10-CM

## 2017-05-05 DIAGNOSIS — M779 Enthesopathy, unspecified: Principal | ICD-10-CM

## 2017-05-05 MED ORDER — TRIAMCINOLONE ACETONIDE 10 MG/ML IJ SUSP: 10.0000 mg | Freq: Once | INTRAMUSCULAR | Status: AC

## 2017-05-05 NOTE — Patient Instructions (Signed)

## 2017-05-05 NOTE — Progress Notes (Signed)
Subjective: Emily Richard is a 67 y.o. female patient who presents to office for evaluation of right foot pain. Patient complains of progressive pain especially over the last month in the right foot states that the pain is worse than before many years ago had a very similar pain states that since she has watched her shoes however she did try a pair of shoes over the Christmas holidays that was narrow and pointed toe aggravated the ball of her foot states that she also has some numbness over the side of the big toe where her bunion is. Patient denies any other pedal complaints. Denies injury/trip/fall/sprain/any causative factors.   Review of Systems  All other systems reviewed and are negative.   Patient Active Problem List   Diagnosis Date Noted  . Schwannoma 11/21/2010    Current Outpatient Medications on File Prior to Visit  Medication Sig Dispense Refill  . aspirin 81 MG tablet Take 81 mg by mouth daily.      Marland Kitchen atorvastatin (LIPITOR) 10 MG tablet 40 mg daily.     . Cholecalciferol (VITAMIN D-1000 MAX ST) 1000 units tablet Take by mouth.    . Clobetasol Prop Emollient Base 0.05 % emollient cream AAA 3-4 times weekly as maintenance 30 g 1  . denosumab (PROLIA) 60 MG/ML SOLN injection Inject into the skin.    . multivitamin-iron-minerals-folic acid (CENTRUM) chewable tablet Chew 1 tablet by mouth daily.      Marland Kitchen omeprazole (PRILOSEC) 20 MG capsule     . ondansetron (ZOFRAN ODT) 4 MG disintegrating tablet Allow 1-2 tablets to dissolve in your mouth every 8 hours as needed for nausea/vomiting 30 tablet 0  . doxycycline (VIBRAMYCIN) 100 MG capsule Take 1 capsule (100 mg total) by mouth 2 (two) times daily. (Patient not taking: Reported on 05/05/2017) 20 capsule 0   No current facility-administered medications on file prior to visit.     Allergies  Allergen Reactions  . Penicillins Hives and Itching    All over body.    Objective:  General: Alert and oriented x3 in no acute  distress  Dermatology: No open lesions bilateral lower extremities, no webspace macerations, no ecchymosis bilateral, all nails x 10 are well manicured.  Vascular: Dorsalis Pedis and Posterior Tibial pedal pulses palpable, Capillary Fill Time 3 seconds,(+) pedal hair growth bilateral, no edema bilateral lower extremities, Temperature gradient within normal limits.  Neurology: Gross sensation intact via light touch bilateral. (- )Tinels sign bilateral.   Musculoskeletal: Mild tenderness with palpation at lateral sesamoid sub-met one on right and 2 medial eminence of bunion on right with likely early second toe joint capsulitis plantarly from overload from bunion deformity, no pain with calf compression bilateral. There is decreased ankle rom with knee extending  vs flexed resembling gastroc equnius bilateral, Subtalar joint range of motion is within normal limits, there is no 1st ray hypermobility noted bilateral, decreased 1st MPJ rom Right>Left with functional limitus noted on weightbearing exam. Strength within normal limits in all groups bilateral.   Gait: Antalgic gait  Xrays  Right foot   Impression: Normal osseous mineralization, joint spaces are within normal limits there is mild increase in intermetatarsal angle of the first supportive of bunion deformity there is no fracture or dislocation soft tissues within normal limits.  Assessment and Plan: Problem List Items Addressed This Visit    None    Visit Diagnoses    Capsulitis of foot, right    -  Primary   Relevant Medications  triamcinolone acetonide (KENALOG) 10 MG/ML injection 10 mg   Hallux valgus of right foot       Sesamoiditis         -Complete examination performed -Xrays reviewed -Discussed treatement options for capsulitis first and second metatarsal phalangeal joint secondary to structural bunion with likely sesamoiditis -After oral consent and aseptic prep, injected a mixture containing 1 ml of 2%  plain  lidocaine, 1 ml 0.5% plain marcaine, 0.5 ml of kenalog 10 and 0.5 ml of dexamethasone phosphate into lateral sesamoid complex sub-met one on right without complication. Post-injection care discussed with patient.  -Dispensed silicone bunion shield and advised patient to wear during the day when in shoes -Recommend icing at the end of the day for the next week and to be mindful of shoe choices and to choose shoes that are good and supportive -Patient to return to office in 4 weeks or as needed or sooner if condition worsens.  Advised patient that may benefit for surgical approach to address bunion deformity which is likely the primary culprit to her symptoms if she fails to get any additional relief from our current plan of care.  Landis Martins, DPM

## 2017-06-02 ENCOUNTER — Ambulatory Visit: Payer: 59 | Admitting: Sports Medicine

## 2017-06-04 ENCOUNTER — Ambulatory Visit
Admission: RE | Admit: 2017-06-04 | Discharge: 2017-06-04 | Disposition: A | Discharge status: Home / Self Care | Payer: 59 | Source: Ambulatory Visit | Attending: Family Medicine | Admitting: Family Medicine

## 2017-06-04 DIAGNOSIS — Z1231 Encounter for screening mammogram for malignant neoplasm of breast: Secondary | ICD-10-CM | POA: Diagnosis present

## 2017-06-11 ENCOUNTER — Telehealth: Payer: Self-pay | Admitting: *Deleted

## 2017-06-11 ENCOUNTER — Encounter: Payer: Self-pay | Admitting: Sports Medicine

## 2017-06-11 ENCOUNTER — Ambulatory Visit: Payer: 59 | Admitting: Sports Medicine

## 2017-06-11 DIAGNOSIS — M79672 Pain in left foot: Secondary | ICD-10-CM | POA: Diagnosis not present

## 2017-06-11 DIAGNOSIS — M79671 Pain in right foot: Secondary | ICD-10-CM

## 2017-06-11 DIAGNOSIS — M7751 Other enthesopathy of right foot: Secondary | ICD-10-CM | POA: Diagnosis not present

## 2017-06-11 DIAGNOSIS — M2580 Other specified joint disorders, unspecified joint: Secondary | ICD-10-CM

## 2017-06-11 DIAGNOSIS — M2011 Hallux valgus (acquired), right foot: Secondary | ICD-10-CM

## 2017-06-11 DIAGNOSIS — D3610 Benign neoplasm of peripheral nerves and autonomic nervous system, unspecified: Secondary | ICD-10-CM

## 2017-06-11 DIAGNOSIS — M779 Enthesopathy, unspecified: Principal | ICD-10-CM

## 2017-06-11 DIAGNOSIS — M778 Other enthesopathies, not elsewhere classified: Secondary | ICD-10-CM

## 2017-06-11 MED ORDER — DICLOFENAC SODIUM 75 MG PO TBEC: 75.0000 mg | DELAYED_RELEASE_TABLET | Freq: Two times a day (BID) | ORAL | 0 refills | Status: DC

## 2017-06-11 NOTE — Telephone Encounter (Signed)
-----   Message from Landis Martins, [place] sent at 06/11/2017  2:20 PM EST ----- Regarding: MRI forefoot bil  MRI forefoot bil Burning and numbness to balls of both feet R/o Neuroma -Dr s

## 2017-06-11 NOTE — Progress Notes (Signed)
Subjective: Emily Richard is a 67 y.o. female patient who presents to office for evaluation of follow-up of right foot pain at bunion and at toe joint. Patient had steroid injection last visit and was given silicone padding and reports no major difference in her pain around her bunion however states continued progressive pain in the balls of both feet feeling like a burning sensation and like her feet are on fire at the balls.  Denies swelling denies bruising denies night sweats vomiting fever chills or any other constitutional symptoms at this time.  Patient denies any other pedal complaints.  Patient Active Problem List   Diagnosis Date Noted  . Schwannoma 11/21/2010    Current Outpatient Medications on File Prior to Visit  Medication Sig Dispense Refill  . aspirin 81 MG tablet Take 81 mg by mouth daily.      Marland Kitchen atorvastatin (LIPITOR) 10 MG tablet 40 mg daily.     . Cholecalciferol (VITAMIN D-1000 MAX ST) 1000 units tablet Take by mouth.    . Clobetasol Prop Emollient Base 0.05 % emollient cream AAA 3-4 times weekly as maintenance 30 g 1  . denosumab (PROLIA) 60 MG/ML SOLN injection Inject into the skin.    Marland Kitchen doxycycline (VIBRAMYCIN) 100 MG capsule Take 1 capsule (100 mg total) by mouth 2 (two) times daily. (Patient not taking: Reported on 05/05/2017) 20 capsule 0  . multivitamin-iron-minerals-folic acid (CENTRUM) chewable tablet Chew 1 tablet by mouth daily.      Marland Kitchen omeprazole (PRILOSEC) 20 MG capsule     . ondansetron (ZOFRAN ODT) 4 MG disintegrating tablet Allow 1-2 tablets to dissolve in your mouth every 8 hours as needed for nausea/vomiting 30 tablet 0   Current Facility-Administered Medications on File Prior to Visit  Medication Dose Route Frequency Provider Last Rate Last Dose  . triamcinolone acetonide (KENALOG) 10 MG/ML injection 10 mg  10 mg Other Once Landis Martins, DPM        Allergies  Allergen Reactions  . Penicillins Hives and Itching    All over body.    Objective:   General: Alert and oriented x3 in no acute distress  Dermatology: No open lesions bilateral lower extremities, no webspace macerations, no ecchymosis bilateral, all nails x 10 are well manicured.  Vascular: Dorsalis Pedis and Posterior Tibial pedal pulses palpable, Capillary Fill Time 3 seconds,(+) pedal hair growth bilateral, no edema bilateral lower extremities, Temperature gradient within normal limits.  Neurology: Gross sensation intact via light touch bilateral. (- )Tinels sign bilateral.  Subjective burning sensation to both balls of feet.  Musculoskeletal: Mild tenderness with palpation at lateral sesamoid sub-met one on right and 2 medial eminence of bunion on right with likely early second toe joint capsulitis plantarly from overload from bunion deformity as previously noted, no pain with calf compression bilateral. There is decreased ankle rom with knee extending  vs flexed resembling gastroc equnius bilateral, Subtalar joint range of motion is within normal limits, there is no 1st ray hypermobility noted bilateral, decreased 1st MPJ rom Right>Left with functional limitus noted on weightbearing exam. Strength within normal limits in all groups bilateral.   Assessment and Plan: Problem List Items Addressed This Visit    None    Visit Diagnoses    Capsulitis of foot, right    -  Primary   Relevant Medications   diclofenac (VOLTAREN) 75 MG EC tablet   Sesamoiditis       Hallux valgus of right foot       Right  foot pain       Relevant Medications   diclofenac (VOLTAREN) 75 MG EC tablet   Neuroma       Relevant Medications   diclofenac (VOLTAREN) 75 MG EC tablet   Left foot pain       Relevant Medications   diclofenac (VOLTAREN) 75 MG EC tablet     -Complete examination performed -Previous xrays reviewed -Discussed treatement options for bunion with capsulitis and sesamoiditis with second MPJ overload and now neuritis versus neuroma however this is probably highly unlikely  since the symptoms are bilateral -Ordered MRI for further evaluation to rule out neuroma or other possible causes of burning sensation to feet -Applied metatarsal padding bilateral and advised patient to continue with this daily if works well may benefit from custom functional foot orthotics -Rx diclofenac to take as needed -Patient to return to after MRI or sooner if condition worsens.  If MRI is negative will consider inflammatory panel to evaluate for possible underlying causes and nerve conduction testing to evaluate for any subsequent radiculopathy that could be contributing to bilateral symptoms.  Landis Martins, DPM

## 2017-06-11 NOTE — Telephone Encounter (Signed)
Orders given to J. Quintana, RN for pre-cert. 

## 2017-06-21 ENCOUNTER — Telehealth: Payer: Self-pay | Admitting: *Deleted

## 2017-06-21 NOTE — Telephone Encounter (Signed)
[place] states pt will need to report to the [facility] at 11:45am for labs and [place] 12:30pm on 06/24/2017.

## 2017-06-22 ENCOUNTER — Telehealth: Payer: Self-pay | Admitting: Sports Medicine

## 2017-06-22 NOTE — Telephone Encounter (Signed)
Gretta Arab, RN states she has the MRI approved for the right and pt needs to change the location from [place] to [place] by [employer] [phone].

## 2017-06-22 NOTE — Telephone Encounter (Signed)
I informed pt of J.Quintana, RN's instructions for her to call [employer] [phone], Authorization# [id number], for CPT 73720.

## 2017-06-22 NOTE — Telephone Encounter (Signed)
[place] states MRIs were pre-certed for [facility] and only one was pre-certed. I informed Gretta Arab, RN, she states she will get pre-certs and change location with pt's insurance.

## 2017-06-22 NOTE — Telephone Encounter (Signed)
[place] called and states she needs another cpt code. Its for both feet and the appt is for [facility] and not [facility] [facility]

## 2017-06-24 ENCOUNTER — Telehealth: Payer: Self-pay | Admitting: Sports Medicine

## 2017-06-24 DIAGNOSIS — Z01812 Encounter for preprocedural laboratory examination: Secondary | ICD-10-CM

## 2017-06-24 NOTE — Telephone Encounter (Signed)
Hi, this is Tammy over at the [facility]. Emily Richard is coming in to our facility tomorrow and she is to have some blood work done. I don't see a lab order for her for a bun and creatinine. I was going to see if we could get a bun and creatinine blood order faxed to our outpatient center which is where she will be going early in the morning. Our outpatient fax number is [phone]. She is coming to our facility for an MRI with and without contrast and she is requiring some blood work before that could be done. So if you could fax that to [place] that would be greatly appreciated. If you have any questions please just give [place] a call back at [phone]. Thank you so much and have a wonderful day. Bye bye.

## 2017-06-24 NOTE — Telephone Encounter (Signed)
Faxed orders to [facility].

## 2017-06-24 NOTE — Telephone Encounter (Signed)
Val Please follow up and order blood work that is needed Thanks Dr. Chauncey Cruel

## 2017-06-24 NOTE — Telephone Encounter (Signed)
Dr. Stover please advise 

## 2017-06-24 NOTE — Addendum Note (Signed)
Addended by: Harriett Sine D on: 06/24/2017 11:12 AM   Modules accepted: Orders

## 2017-06-28 ENCOUNTER — Telehealth: Payer: Self-pay | Admitting: *Deleted

## 2017-06-28 NOTE — Telephone Encounter (Signed)
[facility] states she has not received the Creatinine/Bun for pt that is scheduled for Friday, due to their machine being broken at pt's appt. I told Estill Bamberg I had faxed to the [phone] number as requested on 06/24/2017. Estill Bamberg states that is the outpatient center, to also fax to her in case they did not receive.

## 2017-06-29 NOTE — Telephone Encounter (Signed)
Thanks

## 2017-07-02 ENCOUNTER — Encounter: Payer: Self-pay | Admitting: Sports Medicine

## 2017-07-14 ENCOUNTER — Ambulatory Visit: Payer: 59 | Admitting: Sports Medicine

## 2017-07-14 ENCOUNTER — Encounter: Payer: Self-pay | Admitting: Sports Medicine

## 2017-07-14 DIAGNOSIS — M7751 Other enthesopathy of right foot: Secondary | ICD-10-CM

## 2017-07-14 DIAGNOSIS — M2011 Hallux valgus (acquired), right foot: Secondary | ICD-10-CM | POA: Diagnosis not present

## 2017-07-14 DIAGNOSIS — M779 Enthesopathy, unspecified: Secondary | ICD-10-CM

## 2017-07-14 DIAGNOSIS — M2580 Other specified joint disorders, unspecified joint: Secondary | ICD-10-CM

## 2017-07-14 DIAGNOSIS — D3610 Benign neoplasm of peripheral nerves and autonomic nervous system, unspecified: Secondary | ICD-10-CM | POA: Diagnosis not present

## 2017-07-14 DIAGNOSIS — M778 Other enthesopathies, not elsewhere classified: Secondary | ICD-10-CM

## 2017-07-14 NOTE — Progress Notes (Signed)
Subjective: Emily Richard is a 67 y.o. female patient who returns to office for discussion of MRI results of right foot.  Patient states that the padding has helped tremendously and that the pain is very minimal and has decreased significantly since last visit.  Patient reports that she did not have to take diclofenac.  Patient denies any other pedal complaints.  Patient Active Problem List   Diagnosis Date Noted  . Schwannoma 11/21/2010    Current Outpatient Medications on File Prior to Visit  Medication Sig Dispense Refill  . aspirin 81 MG tablet Take 81 mg by mouth daily.      Marland Kitchen atorvastatin (LIPITOR) 10 MG tablet 40 mg daily.     . Cholecalciferol (VITAMIN D-1000 MAX ST) 1000 units tablet Take by mouth.    . Clobetasol Prop Emollient Base 0.05 % emollient cream AAA 3-4 times weekly as maintenance 30 g 1  . denosumab (PROLIA) 60 MG/ML SOLN injection Inject into the skin.    Marland Kitchen diclofenac (VOLTAREN) 75 MG EC tablet Take 1 tablet (75 mg total) by mouth 2 (two) times daily. 30 tablet 0  . doxycycline (VIBRAMYCIN) 100 MG capsule Take 1 capsule (100 mg total) by mouth 2 (two) times daily. (Patient not taking: Reported on 05/05/2017) 20 capsule 0  . multivitamin-iron-minerals-folic acid (CENTRUM) chewable tablet Chew 1 tablet by mouth daily.      Marland Kitchen omeprazole (PRILOSEC) 20 MG capsule     . ondansetron (ZOFRAN ODT) 4 MG disintegrating tablet Allow 1-2 tablets to dissolve in your mouth every 8 hours as needed for nausea/vomiting 30 tablet 0   Current Facility-Administered Medications on File Prior to Visit  Medication Dose Route Frequency Provider Last Rate Last Dose  . triamcinolone acetonide (KENALOG) 10 MG/ML injection 10 mg  10 mg Other Once Landis Martins, DPM        Allergies  Allergen Reactions  . Penicillins Hives and Itching    All over body.    Objective:  General: Alert and oriented x3 in no acute distress  Dermatology: No open lesions bilateral lower extremities, no  webspace macerations, no ecchymosis bilateral, all nails x 10 are well manicured.  Vascular: Dorsalis Pedis and Posterior Tibial pedal pulses palpable, Capillary Fill Time 3 seconds,(+) pedal hair growth bilateral, no edema bilateral lower extremities, Temperature gradient within normal limits.  Neurology: Gross sensation intact via light touch bilateral. (- )Tinels sign bilateral.  Subjective burning sensation to both balls of feet.  Musculoskeletal: Minimal tenderness with palpation at lateral sesamoid sub-met one on right and 2 medial eminence of bunion on right with likely early second toe joint capsulitis plantarly from overload from bunion deformity as previously noted, no pain with calf compression bilateral. There is decreased ankle rom with knee extending  vs flexed resembling gastroc equnius bilateral, Subtalar joint range of motion is within normal limits, there is no 1st ray hypermobility noted bilateral, decreased 1st MPJ rom Right>Left with functional limitus noted on weightbearing exam. Strength within normal limits in all groups bilateral.   MRIs supportive of bunion with arthritis and neuroma between the third and fourth metatarsal heads on right  Assessment and Plan: Problem List Items Addressed This Visit    None    Visit Diagnoses    Neuroma    -  Primary   Capsulitis of foot, right       Sesamoiditis       Hallux valgus of right foot         -Complete examination  performed -MRI results reviewed -She declined injection therapy at this time -Patient to continue with metatarsal padding bilateral  -Office to check orthotic benefits and if patient decides to proceed with orthotics we will schedule for casting for orthotics with neuroma offloading padding -Continue with diclofenac to take as needed for a flare -Patient to return to office for orthotics or sooner if problems or issues arise.    Landis Martins, DPM

## 2017-09-20 ENCOUNTER — Ambulatory Visit: Payer: 59 | Admitting: Obstetrics and Gynecology

## 2017-09-20 ENCOUNTER — Encounter: Payer: Self-pay | Admitting: Obstetrics and Gynecology

## 2017-09-20 VITALS — BP 120/74 | HR 72 | Ht 66.0 in | Wt 175.0 lb

## 2017-09-20 DIAGNOSIS — N898 Other specified noninflammatory disorders of vagina: Secondary | ICD-10-CM

## 2017-09-20 LAB — POCT WET PREP WITH KOH
CLUE CELLS WET PREP PER HPF POC: NEGATIVE
KOH Prep POC: NEGATIVE
Trichomonas, UA: NEGATIVE
YEAST WET PREP PER HPF POC: NEGATIVE

## 2017-09-20 MED ORDER — TERCONAZOLE 0.8 % VA CREA: 1.0000 | TOPICAL_CREAM | Freq: Every day | VAGINAL | 0 refills | Status: AC

## 2017-09-20 NOTE — Progress Notes (Signed)
[name], MD   Chief Complaint  Patient presents with  . Vaginitis    Vaginal irritation     HPI:      [name] is a 67 y.o. [phone] who LMP was No LMP recorded. Patient is postmenopausal., presents today for internal vaginal irritation for the past 3-4 wks. No increased d/c, odor but urine causes discomfort. No recent abx use. No urin sx. She is not currently sex active due to dyspareunia. No improvement with vag ERT in the past. 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  Last annual 4/18. Due for pap this yr.   Past Medical History:  Diagnosis Date  . Bursitis   . Family history of breast cancer    mother, aunt  . GERD (gastroesophageal reflux disease)   . Hyperlipidemia   . Osteoporosis   . Poor circulation   . Schwannoma   . Shortness of breath     Past Surgical History:  Procedure Laterality Date  . BREAST EXCISIONAL BIOPSY Right 2003   NEG  . COLONOSCOPY  01/2014  . tumor biopsy  09/2010  . VULVA /PERINEUM BIOPSY      Family History  Problem Relation Age of Onset  . Other Mother        left mastectomy 1998  . Osteoporosis Mother   . Breast cancer Mother 67  . Uterine cancer Mother 47  . Breast cancer Maternal Aunt 57    Social History   Socioeconomic History  . Marital status: Married    Spouse name: Not on file  . Number of children: Not on file  . Years of education: Not on file  . Highest education level: Not on file  Occupational History  . Not on file  Social Needs  . Financial resource strain: Not on file  . Food insecurity:    Worry: Not on file    Inability: Not on file  . Transportation needs:    Medical: Not on file    Non-medical: Not on file  Tobacco Use  . Smoking status: Never Smoker  . Smokeless tobacco: Never Used  Substance and Sexual Activity  . Alcohol use: No  . Drug use: No  . Sexual activity: Not Currently  Lifestyle  . Physical activity:    Days per week: Not  on file    Minutes per session: Not on file  . Stress: Not on file  Relationships  . Social connections:    Talks on phone: Not on file    Gets together: Not on file    Attends religious service: Not on file    Active member of club or organization: Not on file    Attends meetings of clubs or organizations: Not on file    Relationship status: Not on file  . Intimate partner violence:    Fear of current or ex partner: Not on file    Emotionally abused: Not on file    Physically abused: Not on file    Forced sexual activity: Not on file  Other Topics Concern  . Not on file  Social History Narrative  . Not on file    Outpatient Medications Prior to Visit  Medication Sig Dispense Refill  . aspirin 81 MG tablet Take 81 mg by mouth daily.      Marland Kitchen atorvastatin (LIPITOR) 40 MG tablet Take 40 mg by mouth daily.  11  . Cholecalciferol (VITAMIN D-1000 MAX  ST) 1000 units tablet Take by mouth.    . clobetasol cream (TEMOVATE) 0.05 % Apply topically.    Marland Kitchen CRANBERRY FRUIT PO Take by mouth.    . denosumab (PROLIA) 60 MG/ML SOLN injection Inject into the skin.    . multivitamin-iron-minerals-folic acid (CENTRUM) chewable tablet Chew 1 tablet by mouth daily.      Marland Kitchen omeprazole (PRILOSEC) 20 MG capsule     . diclofenac (VOLTAREN) 75 MG EC tablet Take 1 tablet (75 mg total) by mouth 2 (two) times daily. (Patient not taking: Reported on 09/20/2017) 30 tablet 0  . doxycycline (VIBRAMYCIN) 100 MG capsule Take 1 capsule (100 mg total) by mouth 2 (two) times daily. (Patient not taking: Reported on 05/05/2017) 20 capsule 0  . ondansetron (ZOFRAN ODT) 4 MG disintegrating tablet Allow 1-2 tablets to dissolve in your mouth every 8 hours as needed for nausea/vomiting (Patient not taking: Reported on 09/20/2017) 30 tablet 0  . atorvastatin (LIPITOR) 10 MG tablet 40 mg daily.     . Clobetasol Prop Emollient Base 0.05 % emollient cream AAA 3-4 times weekly as maintenance 30 g 1   Facility-Administered Medications  Prior to Visit  Medication Dose Route Frequency Provider Last Rate Last Dose  . triamcinolone acetonide (KENALOG) 10 MG/ML injection 10 mg  10 mg Other Once Landis Martins, DPM        ROS:  Review of Systems  Constitutional: Negative for fever.  Gastrointestinal: Negative for blood in stool, constipation, diarrhea, nausea and vomiting.  Genitourinary: Positive for vaginal pain. Negative for dyspareunia, dysuria, flank pain, frequency, hematuria, urgency, vaginal bleeding and vaginal discharge.  Musculoskeletal: Negative for back pain.  Skin: Negative for rash.   OBJECTIVE:   Vitals:  BP 120/74   Pulse 72   Ht 5\' 6"  (1.676 m)   Wt 175 lb (79.4 kg)   BMI 28.25 kg/m   Physical Exam  Constitutional: She is oriented to person, place, and time. Vital signs are normal. She appears well-developed.  Pulmonary/Chest: Effort normal.  Genitourinary: Vagina normal and uterus normal. There is no rash, tenderness or lesion on the right labia. There is no rash, tenderness or lesion on the left labia. Uterus is not enlarged and not tender. Cervix exhibits no motion tenderness. Right adnexum displays no mass and no tenderness. Left adnexum displays no mass and no tenderness. No erythema or tenderness in the vagina. No vaginal discharge found.  Genitourinary Comments: NEG EXT AND INT EXAM; LS CONTROLLED WITH CLOBETASOL  Musculoskeletal: Normal range of motion.  Neurological: She is alert and oriented to person, place, and time.  Psychiatric: She has a normal mood and affect. Her behavior is normal. Thought content normal.  Vitals reviewed.   Results: Results for orders placed or performed in visit on 09/20/17 (from the past 24 hour(s))  POCT Wet Prep with KOH     Status: Normal   Collection Time: 09/20/17  4:56 PM  Result Value Ref Range   Trichomonas, UA Negative    Clue Cells Wet Prep HPF POC neg    Epithelial Wet Prep HPF POC  Few, Moderate, Many, Too numerous to count   Yeast Wet Prep  HPF POC neg    Bacteria Wet Prep HPF POC  Few   RBC Wet Prep HPF POC     WBC Wet Prep HPF POC     KOH Prep POC Negative Negative     Assessment/Plan: Vaginal irritation - Neg exam/wet prep. Treats with clobetasol already. Treat empirically with Rx  terazol. If sx persist, will try vag ERT. F/u prn/1 mo annual.  - Plan: terconazole (TERAZOL 3) 0.8 % vaginal cream, POCT Wet Prep with KOH    Meds ordered this encounter  Medications  . terconazole (TERAZOL 3) 0.8 % vaginal cream    Sig: Place 1 applicator vaginally at bedtime for 3 days.    Dispense:  20 g    Refill:  0    Order Specific Question:   Supervising Provider    Answer:   Gae Dry [[id number]]      Return in about 1 month (around 08/24/5049) for annual.  Alicia B. Copland, PA-C 09/20/2017 5:00 PM

## 2017-09-20 NOTE — Patient Instructions (Signed)
I value your feedback and entrusting us with your care. If you get a [facility] patient survey, I would appreciate you taking the time to let us know about your experience today. Thank you! 

## 2017-10-27 ENCOUNTER — Ambulatory Visit (INDEPENDENT_AMBULATORY_CARE_PROVIDER_SITE_OTHER): Payer: 59 | Admitting: Obstetrics and Gynecology

## 2017-10-27 ENCOUNTER — Other Ambulatory Visit (HOSPITAL_COMMUNITY)
Admission: RE | Admit: 2017-10-27 | Discharge: 2017-10-27 | Disposition: A | Discharge status: Home / Self Care | Payer: 59 | Source: Ambulatory Visit | Attending: Obstetrics and Gynecology | Admitting: Obstetrics and Gynecology

## 2017-10-27 ENCOUNTER — Encounter: Payer: Self-pay | Admitting: Obstetrics and Gynecology

## 2017-10-27 VITALS — BP 110/70 | HR 77 | Ht 66.0 in | Wt 176.0 lb

## 2017-10-27 DIAGNOSIS — Z1151 Encounter for screening for human papillomavirus (HPV): Secondary | ICD-10-CM | POA: Diagnosis present

## 2017-10-27 DIAGNOSIS — Z124 Encounter for screening for malignant neoplasm of cervix: Secondary | ICD-10-CM | POA: Diagnosis not present

## 2017-10-27 DIAGNOSIS — Z1231 Encounter for screening mammogram for malignant neoplasm of breast: Secondary | ICD-10-CM

## 2017-10-27 DIAGNOSIS — Z01419 Encounter for gynecological examination (general) (routine) without abnormal findings: Secondary | ICD-10-CM

## 2017-10-27 DIAGNOSIS — Z1239 Encounter for other screening for malignant neoplasm of breast: Secondary | ICD-10-CM

## 2017-10-27 DIAGNOSIS — Z803 Family history of malignant neoplasm of breast: Secondary | ICD-10-CM

## 2017-10-27 DIAGNOSIS — L280 Lichen simplex chronicus: Secondary | ICD-10-CM

## 2017-10-27 DIAGNOSIS — N898 Other specified noninflammatory disorders of vagina: Secondary | ICD-10-CM

## 2017-10-27 MED ORDER — CLOBETASOL PROPIONATE 0.05 % EX CREA: TOPICAL_CREAM | CUTANEOUS | 1 refills | Status: DC

## 2017-10-27 NOTE — Progress Notes (Signed)
Chief Complaint  Patient presents with  . Gynecologic Exam     HPI:      [name] is a 67 y.o. [phone] who LMP was No LMP recorded. Patient is postmenopausal., presents today for her annual examination. Her menses are absent due to menopause.  She does not have intermenstrual bleeding.  She does not have vasomotor sx.   Sex activity: not sexually active. She does have vaginal dryness. She has done PT for dyspareunia and vag ERT without relief. It's just too painful. Most likely related to scar tissue from post delivery stitches.   She had a vulvar abscess this past yr that resolved. She was seen 6/19 for vaginal irritation with neg wet prep/exam. Treated empirically with terazol without relief. She still has irritated area post fourchette that feels like it might split, worse after urination. Wears pads for urinary incont.   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 Rx RF due.   Last Pap: May 29, 2014  Results were: no abnormalities /neg HPV DNA.   Last mammogram: June 04, 2017 Results were: normal--routine follow-up in 12 months There is a FH of breast cancer in her mom and mat aunt, genetic testing not indicated. There is no FH of ovarian cancer. The patient does do self-breast exams.  Colonoscopy: colonoscopy 4 years ago without abnormalities. Repeat after 10 yrs. DEXA: osteoporosis by PCP, declines tx  Tobacco use: The patient denies current or previous tobacco use. Alcohol use: none Exercise: moderately active  She does get adequate calcium and Vitamin D in her diet.  Labs with PCP   Past Medical History:  Diagnosis Date  . Bursitis   . Family history of breast cancer    mother, aunt  . GERD (gastroesophageal reflux disease)   . Hyperlipidemia   . Osteoporosis   . Poor circulation   . Schwannoma   . Shortness of breath     Past Surgical History:  Procedure Laterality Date  . BREAST EXCISIONAL  BIOPSY Right 2003   NEG  . COLONOSCOPY  01/2014  . tumor biopsy  09/2010  . VULVA /PERINEUM BIOPSY      Family History  Problem Relation Age of Onset  . Other Mother        left mastectomy 1998  . Osteoporosis Mother   . Breast cancer Mother 49  . Uterine cancer Mother 73  . Breast cancer Maternal Aunt 57    Social History   Socioeconomic History  . Marital status: Married    Spouse name: Not on file  . Number of children: Not on file  . Years of education: Not on file  . Highest education level: Not on file  Occupational History  . Not on file  Social Needs  . Financial resource strain: Not on file  . Food insecurity:    Worry: Not on file    Inability: Not on file  . Transportation needs:    Medical: Not on file    Non-medical: Not on file  Tobacco Use  . Smoking status: Never Smoker  . Smokeless tobacco: Never Used  Substance and Sexual Activity  . Alcohol use: No  . Drug use: No  . Sexual activity: Not Currently    Birth control/protection: Post-menopausal  Lifestyle  . Physical activity:    Days per week: Not on file    Minutes per session: Not on file  . Stress: Not on file  Relationships  . Social connections:    Talks on phone: Not on file    Gets together: Not on file    Attends religious service: Not on file    Active member of club or organization: Not on file    Attends meetings of clubs or organizations: Not on file    Relationship status: Not on file  . Intimate partner violence:    Fear of current or ex partner: Not on file    Emotionally abused: Not on file    Physically abused: Not on file    Forced sexual activity: Not on file  Other Topics Concern  . Not on file  Social History Narrative  . Not on file    Current Outpatient Medications on File Prior to Visit  Medication Sig Dispense Refill  . aspirin 81 MG tablet Take 81 mg by mouth daily.      Marland Kitchen atorvastatin (LIPITOR) 40 MG tablet Take 40 mg by mouth daily.  11  .  Cholecalciferol (VITAMIN D-1000 MAX ST) 1000 units tablet Take by mouth.    Drusilla Kanner FRUIT PO Take by mouth.    . denosumab (PROLIA) 60 MG/ML SOLN injection Inject into the skin.    . multivitamin-iron-minerals-folic acid (CENTRUM) chewable tablet Chew 1 tablet by mouth daily.      Marland Kitchen omeprazole (PRILOSEC) 20 MG capsule     . ondansetron (ZOFRAN ODT) 4 MG disintegrating tablet Allow 1-2 tablets to dissolve in your mouth every 8 hours as needed for nausea/vomiting (Patient not taking: Reported on 09/20/2017) 30 tablet 0   Current Facility-Administered Medications on File Prior to Visit  Medication Dose Route Frequency Provider Last Rate Last Dose  . triamcinolone acetonide (KENALOG) 10 MG/ML injection 10 mg  10 mg Other Once Landis Martins, DPM          ROS:  Review of Systems  Constitutional: Negative for fatigue, fever and unexpected weight change.  Respiratory: Negative for cough, shortness of breath and wheezing.   Cardiovascular: Negative for chest pain, palpitations and leg swelling.  Gastrointestinal: Negative for blood in stool, constipation, diarrhea, nausea and vomiting.  Endocrine: Negative for cold intolerance, heat intolerance and polyuria.  Genitourinary: Positive for dyspareunia and dysuria. Negative for flank pain, frequency, genital sores, hematuria, menstrual problem, pelvic pain, urgency, vaginal bleeding, vaginal discharge and vaginal pain.  Musculoskeletal: Negative for back pain, joint swelling and myalgias.  Skin: Negative for rash.  Neurological: Negative for dizziness, syncope, light-headedness, numbness and headaches.  Hematological: Negative for adenopathy.  Psychiatric/Behavioral: Negative for agitation, confusion, sleep disturbance and suicidal ideas. The patient is not nervous/anxious.      Objective: BP 110/70   Pulse 77   Ht 5\' 6"  (1.676 m)   Wt 176 lb (79.8 kg)   BMI 28.41 kg/m    Physical Exam  Constitutional: She is oriented to person,  place, and time. She appears well-developed and well-nourished.  Genitourinary: Vagina normal and uterus normal. There is no rash or tenderness on the right labia. There is no rash or tenderness on the left labia. No erythema or tenderness in the vagina. No vaginal discharge found. Right adnexum does not display mass and does not display tenderness. Left adnexum does not display mass and does not display tenderness. Cervix does not exhibit motion tenderness or polyp. Uterus is not enlarged or tender.  Genitourinary Comments: PALE, THIN, ATROPHIC VAG TISSUE; LS UNDER CONTROL  Neck: Normal range of motion. No thyromegaly present.  Cardiovascular: Normal rate, regular rhythm  and normal heart sounds.  No murmur heard. Pulmonary/Chest: Effort normal and breath sounds normal. Right breast exhibits no mass, no nipple discharge, no skin change and no tenderness. Left breast exhibits no mass, no nipple discharge, no skin change and no tenderness.  Abdominal: Soft. There is no tenderness. There is no guarding.  Musculoskeletal: Normal range of motion.  Neurological: She is alert and oriented to person, place, and time. No cranial nerve deficit.  Psychiatric: She has a normal mood and affect. Her behavior is normal.  Vitals reviewed.   Assessment/Plan: Encounter for annual routine gynecological examination  Cervical cancer screening - Plan: Cytology - PAP  Screening for HPV (human papillomavirus) - Plan: Cytology - PAP  Screening for breast cancer - Pt to sched mammo.  - Plan: MM 3D SCREEN BREAST BILATERAL  Family history of breast cancer - Does not qualify for cancer genetic testing. Add VIt D3, do 3D mammos. - Plan: MM 3D SCREEN BREAST BILATERAL  Vaginal irritation - Post fourchette. Atrophic and thin. Try ext vag ERT. Premarin vag crm sample given. Use nightly for 1 wk to see if sx improve. F/u prn.   LSC (lichen simplex chronicus) - Rx RF clobetasol. F/u prn.  - Plan: clobetasol cream (TEMOVATE)  0.05 %  Meds ordered this encounter  Medications  . clobetasol cream (TEMOVATE) 0.05 %    Sig: AAA sparingly 3-4 times weekly    Dispense:  45 g    Refill:  1    Hold Rx until pt calls for RF.    Order Specific Question:   Supervising Provider    Answer:   Gae Dry [[id number]]             GYN counsel breast self exam, mammography screening, menopause, adequate intake of calcium and vitamin D, diet and exercise     F/U  Return in about 1 year (around 10/28/2018).  Alicia B. Copland, PA-C 10/27/2017 9:08 AM

## 2017-10-27 NOTE — Patient Instructions (Signed)
I value your feedback and entrusting us with your care. If you get a [facility] patient survey, I would appreciate you taking the time to let us know about your experience today. Thank you! 

## 2017-10-28 LAB — CYTOLOGY - PAP
Diagnosis: NEGATIVE
HPV (WINDOPATH): NOT DETECTED

## 2018-05-05 ENCOUNTER — Other Ambulatory Visit: Payer: Self-pay | Admitting: Family Medicine

## 2018-05-05 DIAGNOSIS — Z1231 Encounter for screening mammogram for malignant neoplasm of breast: Secondary | ICD-10-CM

## 2018-06-06 ENCOUNTER — Ambulatory Visit
Admission: RE | Admit: 2018-06-06 | Discharge: 2018-06-06 | Disposition: A | Discharge status: Home / Self Care | Payer: Medicare Other | Source: Ambulatory Visit | Attending: Family Medicine | Admitting: Family Medicine

## 2018-06-06 DIAGNOSIS — Z1231 Encounter for screening mammogram for malignant neoplasm of breast: Secondary | ICD-10-CM | POA: Diagnosis present

## 2018-06-17 ENCOUNTER — Ambulatory Visit (INDEPENDENT_AMBULATORY_CARE_PROVIDER_SITE_OTHER): Payer: Medicare Other | Admitting: Obstetrics & Gynecology

## 2018-06-17 ENCOUNTER — Encounter: Payer: Self-pay | Admitting: Obstetrics & Gynecology

## 2018-06-17 VITALS — BP 120/80 | Ht 66.0 in | Wt 180.0 lb

## 2018-06-17 DIAGNOSIS — R102 Pelvic and perineal pain: Secondary | ICD-10-CM

## 2018-06-17 LAB — POCT URINALYSIS DIPSTICK
Bilirubin, UA: NEGATIVE
Glucose, UA: NEGATIVE
Ketones, UA: NEGATIVE
Leukocytes, UA: NEGATIVE
NITRITE UA: NEGATIVE
PH UA: 7 (ref 5.0–8.0)
PROTEIN UA: NEGATIVE
RBC UA: POSITIVE
SPEC GRAV UA: 1.02 (ref 1.010–1.025)
UROBILINOGEN UA: 1 U/dL

## 2018-06-17 MED ORDER — MELOXICAM 7.5 MG PO TABS: 7.5000 mg | ORAL_TABLET | Freq: Every day | ORAL | 1 refills | Status: DC

## 2018-06-17 NOTE — Progress Notes (Signed)
Gynecology Pelvic Pain Evaluation   Chief Complaint  Patient presents with  . Pelvic Pain    dull pain    History of Present Illness:   Patient is a 68 y.o. [id number] who has No LMP recorded. Patient is postmenopausal., presents today for a problem visit.  She complains of pain.   Her pain is localized to the lower pelvis/bladder area; pain is worse when emptying bladder but not a burning sensation; this has been going on for a while but TODAY has noted continued pain even when not using the bathroom, severe, no radiation; chjaracterized as dull and continuous.  Denies bleeding, n/v/d, hematuria, freq, urgency.  Pt has Lichen sclerosis well controlled; vaginal atrophy and dyspareunia not on treatment and did not tolerate vaginal estrogen therapy in past; is not sexually active for 5 years.  PMHx: She  has a past medical history of Bursitis, Family history of breast cancer, GERD (gastroesophageal reflux disease), Hyperlipidemia, Osteoporosis, Poor circulation, Schwannoma, and Shortness of breath. Also,  has a past surgical history that includes tumor biopsy (09/2010); Vulva / perineum biopsy; Colonoscopy (01/2014); and Breast excisional biopsy (Right, 2003)., family history includes Breast cancer (age of onset: 85) in her maternal aunt; Breast cancer (age of onset: 8) in her mother; Osteoporosis in her mother; Other in her mother; Uterine cancer (age of onset: 23) in her mother.,  reports that she has never smoked. She has never used smokeless tobacco. She reports that she does not drink alcohol or use drugs.  She has a current medication list which includes the following prescription(s): aspirin, atorvastatin, cholecalciferol, clobetasol cream, cranberry, denosumab, multivitamin-iron-minerals-folic acid, omeprazole, ondansetron, and meloxicam, and the following Facility-Administered Medications: triamcinolone acetonide. Also, is allergic to penicillins.  Review of Systems  Constitutional: Negative  for chills, fever and malaise/fatigue.  HENT: Negative for congestion, sinus pain and sore throat.   Eyes: Negative for blurred vision and pain.  Respiratory: Negative for cough and wheezing.   Cardiovascular: Negative for chest pain and leg swelling.  Gastrointestinal: Negative for abdominal pain, constipation, diarrhea, heartburn, nausea and vomiting.  Genitourinary: Negative for dysuria, frequency, hematuria and urgency.  Musculoskeletal: Negative for back pain, joint pain, myalgias and neck pain.  Skin: Negative for itching and rash.  Neurological: Negative for dizziness, tremors and weakness.  Endo/Heme/Allergies: Does not bruise/bleed easily.  Psychiatric/Behavioral: Negative for depression. The patient is not nervous/anxious and does not have insomnia.     Objective: BP 120/80   Ht 5\' 6"  (1.676 m)   Wt 180 lb (81.6 kg)   BMI 29.05 kg/m  Physical Exam Constitutional:      General: She is not in acute distress.    Appearance: She is well-developed.  Genitourinary:     Pelvic exam was performed with patient supine.     Vulva, urethra, bladder and cervix normal.     No urethral prolapse present.     Bladder is tender.     No lesions in the vagina.     Vaginal tenderness and atrophy present.     No vaginal erythema or bleeding.     No cervical polyp or nabothian cyst.     Uterus is mobile.     Uterus is not enlarged or tender.     No uterine mass detected.    Uterus is midaxial.     No right or left adnexal mass present.     Right adnexa not tender.     Left adnexa not tender.  Genitourinary Comments: Mild Gr 1 uterine prolapse and cystocele  HENT:     Head: Normocephalic and atraumatic.     Nose: Nose normal.  Abdominal:     General: There is no distension.     Palpations: Abdomen is soft.     Tenderness: There is no abdominal tenderness.  Musculoskeletal: Normal range of motion.  Neurological:     Mental Status: She is alert and oriented to person, place, and  time.     Cranial Nerves: No cranial nerve deficit.  Skin:    General: Skin is warm and dry.    Results for orders placed or performed in visit on 06/17/18  POCT urinalysis dipstick  Result Value Ref Range   Color, UA     Clarity, UA     Glucose, UA Negative Negative   Bilirubin, UA neg    Ketones, UA neg    Spec Grav, UA 1.020 1.010 - 1.025   Blood, UA positive    pH, UA 7.0 5.0 - 8.0   Protein, UA Negative Negative   Urobilinogen, UA 1.0 0.2 or 1.0 E.U./dL   Nitrite, UA neg    Leukocytes, UA Negative Negative   Appearance     Odor     Female chaperone present for pelvic portion of the physical exam  Assessment: 68 y.o. [id number] with female pelvic pain, chronic with superimposed new onset acute lower pelvic pain..  1. Pelvic pain 2. Mild pelvic organ prolapse 3. Vaginal atrophy and dyspareunia (chronic)  No s/sx UTI or hematuria today Normal exam, tender exam, no mass Vaginal atrophy present No palpable mass or scar tissue in vagina Uterus small, no mass, mild descensus  Plan to monitor current new pain over weekend, Meloxicam or otc meds for pain, can re-evaluate if persists Osphena or Intrarosa for vag atrophy and pain discussed, may be different pain than current acute pain Pessary or surgery discussed, may help w prolpase which may contribute to her pain; uncertain etiology at this time.  A total of 80minutes were spent face-to-face with the patient during this encounter and over half of that time dealt with counseling and coordination of care.  Barnett Applebaum, MD, [facility], [facility] 06/17/2018  10:49 AM

## 2018-11-03 NOTE — Progress Notes (Signed)
Chief Complaint  Patient presents with  . Gynecologic Exam     HPI:      [name] is a 68 y.o. [phone] who LMP was No LMP recorded. Patient is postmenopausal., presents today for her MEDICARE breast/pelvic examination. Her menses are absent due to menopause.  She does not have intermenstrual bleeding. Pelvic pain from 2/20 improved. Still gets occas "twinge" with urination. Has good flow, denies urinary hesitancy/frequency/urgency. Hx of mild uterine prolapse/cystocele 2/20.  Sex activity: not sexually active. She does have vaginal dryness. [name]   She has a hx of LS and uses clobetasol about 3-4 times weekly with sx relief. Rx RF due.   Last Pap: 10/27/17  Results were: no abnormalities /neg HPV DNA.   Last mammogram: 06/06/18  Results were: normal--routine follow-up in 12 months There is a FH of breast cancer in her mom and mat aunt, genetic testing not indicated. There is no FH of ovarian cancer. The patient does do self-breast exams.  Colonoscopy: colonoscopy 5 years ago without abnormalities. Repeat due this yr. Has appt end of July. Sched by PCP.  DEXA: osteoporosis by PCP, declines tx  Tobacco use: The patient denies current or previous tobacco use. Alcohol use: none Exercise: moderately active  She does get adequate calcium and Vitamin D in her diet.  Labs with PCP   Past Medical History:  Diagnosis Date  . Bursitis   . Family history of breast cancer    mother, aunt  . GERD (gastroesophageal reflux disease)   . Hyperlipidemia   . Osteoporosis   . Poor circulation   . Schwannoma   . Shortness of breath     Past Surgical History:  Procedure Laterality Date  . BREAST EXCISIONAL BIOPSY Right 2003   NEG  . COLONOSCOPY  01/2014  . tumor biopsy  09/2010  . VULVA /PERINEUM BIOPSY      Family  History  Problem Relation Age of Onset  . Other Mother        left mastectomy 1998  . Osteoporosis Mother   . Breast cancer Mother 23  . Uterine cancer Mother 63  . Breast cancer Maternal Aunt 57    Social History   Socioeconomic History  . Marital status: Married    Spouse name: Not on file  . Number of children: Not on file  . Years of education: Not on file  . Highest education level: Not on file  Occupational History  . Not on file  Social Needs  . Financial resource strain: Not on file  . Food insecurity    Worry: Not on file    Inability: Not on file  . Transportation needs    Medical: Not on file    Non-medical: Not on file  Tobacco Use  . Smoking status: Never Smoker  . Smokeless tobacco: Never Used  Substance and Sexual Activity  . Alcohol use: No  . Drug use: No  . Sexual activity: Not Currently    Birth control/protection: Post-menopausal  Lifestyle  . Physical activity    Days per week: Not on file    Minutes per session: Not on file  . Stress: Not on file  Relationships  . Social Herbalist on phone: Not on file    Gets together: Not on file  Attends religious service: Not on file    Active member of club or organization: Not on file    Attends meetings of clubs or organizations: Not on file    Relationship status: Not on file  . Intimate partner violence    Fear of current or ex partner: Not on file    Emotionally abused: Not on file    Physically abused: Not on file    Forced sexual activity: Not on file  Other Topics Concern  . Not on file  Social History Narrative  . Not on file    Current Outpatient Medications on File Prior to Visit  Medication Sig Dispense Refill  . aspirin 81 MG tablet Take 81 mg by mouth daily.      Marland Kitchen atorvastatin (LIPITOR) 40 MG tablet Take 40 mg by mouth daily.  11  . Cholecalciferol (VITAMIN D-1000 MAX ST) 1000 units tablet Take by mouth.    Drusilla Kanner FRUIT PO Take by mouth.    . denosumab  (PROLIA) 60 MG/ML SOLN injection Inject into the skin.    . meloxicam (MOBIC) 7.5 MG tablet Take 1 tablet (7.5 mg total) by mouth daily. For pain. 25 tablet 1  . multivitamin-iron-minerals-folic acid (CENTRUM) chewable tablet Chew 1 tablet by mouth daily.      Marland Kitchen omeprazole (PRILOSEC) 20 MG capsule      Current Facility-Administered Medications on File Prior to Visit  Medication Dose Route Frequency Provider Last Rate Last Dose  . triamcinolone acetonide (KENALOG) 10 MG/ML injection 10 mg  10 mg Other Once Landis Martins, DPM          ROS:  Review of Systems  Constitutional: Negative for fatigue, fever and unexpected weight change.  Respiratory: Negative for cough, shortness of breath and wheezing.   Cardiovascular: Negative for chest pain, palpitations and leg swelling.  Gastrointestinal: Negative for blood in stool, constipation, diarrhea, nausea and vomiting.  Endocrine: Negative for cold intolerance, heat intolerance and polyuria.  Genitourinary: Negative for dyspareunia, dysuria, flank pain, frequency, genital sores, hematuria, menstrual problem, pelvic pain, urgency, vaginal bleeding, vaginal discharge and vaginal pain.  Musculoskeletal: Negative for back pain, joint swelling and myalgias.  Skin: Negative for rash.  Neurological: Negative for dizziness, syncope, light-headedness, numbness and headaches.  Hematological: Negative for adenopathy.  Psychiatric/Behavioral: Negative for agitation, confusion, sleep disturbance and suicidal ideas. The patient is not nervous/anxious.      Objective: BP 114/62   Ht 5\' 6"  (1.676 m)   Wt 179 lb 3.2 oz (81.3 kg)   BMI 28.92 kg/m    Physical Exam Constitutional:      Appearance: She is well-developed.  Genitourinary:     Vulva, vagina, uterus, right adnexa and left adnexa normal.     No vulval lesion or tenderness noted.     No vaginal discharge, erythema or tenderness.     No cervical motion tenderness or polyp.     Uterus is  not enlarged or tender.     No right or left adnexal mass present.     Right adnexa not tender.     Left adnexa not tender.     Genitourinary Comments: PALE, THIN, ATROPHIC VAG TISSUE; LS UNDER CONTROL; MILD, GRADE 1 CYSTOCELE  Neck:     Musculoskeletal: Normal range of motion.     Thyroid: No thyromegaly.  Cardiovascular:     Rate and Rhythm: Normal rate and regular rhythm.     Heart sounds: Normal heart sounds. No murmur.  Pulmonary:  Effort: Pulmonary effort is normal.     Breath sounds: Normal breath sounds.  Chest:     Breasts:        Right: No mass, nipple discharge, skin change or tenderness.        Left: No mass, nipple discharge, skin change or tenderness.  Abdominal:     Palpations: Abdomen is soft.     Tenderness: There is no abdominal tenderness. There is no guarding.  Musculoskeletal: Normal range of motion.  Neurological:     General: No focal deficit present.     Mental Status: She is alert and oriented to person, place, and time.     Cranial Nerves: No cranial nerve deficit.  Skin:    General: Skin is warm and dry.  Psychiatric:        Mood and Affect: Mood normal.        Behavior: Behavior normal.        Thought Content: Thought content normal.        Judgment: Judgment normal.  Vitals signs reviewed.     Assessment/Plan: Encounter for annual routine gynecological examination - Plan: MEDICARE breast/pelvic  Screening for breast cancer - Plan: Pt due for mammo 2/21.  Lichen sclerosus - Plan: clobetasol cream (TEMOVATE) 0.05 %, Rx RF clobetasol. Try BID to TID since sx controlled. Re-eval in 1 yr due to slight increased risk of skin cancer with LS.    Meds ordered this encounter  Medications  . clobetasol cream (TEMOVATE) 0.05 %    Sig: AAA sparingly 3-4 times weekly    Dispense:  45 g    Refill:  0    Hold Rx until pt calls for RF.    Order Specific Question:   Supervising Provider    Answer:   Gae Dry [[id number]]             GYN  counsel breast self exam, mammography screening, menopause, adequate intake of calcium and vitamin D, diet and exercise     F/U  Return in about 2 years (around 11/03/2020). annual; 1 yr for LS f/u  Loretto Belinsky B. Maddon Horton, PA-C 11/04/2018 10:02 AM

## 2018-11-04 ENCOUNTER — Ambulatory Visit (INDEPENDENT_AMBULATORY_CARE_PROVIDER_SITE_OTHER): Payer: Medicare Other | Admitting: Obstetrics and Gynecology

## 2018-11-04 ENCOUNTER — Encounter: Payer: Self-pay | Admitting: Obstetrics and Gynecology

## 2018-11-04 ENCOUNTER — Other Ambulatory Visit: Payer: Self-pay

## 2018-11-04 VITALS — BP 114/62 | Ht 66.0 in | Wt 179.2 lb

## 2018-11-04 DIAGNOSIS — Z1239 Encounter for other screening for malignant neoplasm of breast: Secondary | ICD-10-CM

## 2018-11-04 DIAGNOSIS — L280 Lichen simplex chronicus: Secondary | ICD-10-CM

## 2018-11-04 DIAGNOSIS — Z01419 Encounter for gynecological examination (general) (routine) without abnormal findings: Secondary | ICD-10-CM | POA: Diagnosis not present

## 2018-11-04 DIAGNOSIS — L900 Lichen sclerosus et atrophicus: Secondary | ICD-10-CM

## 2018-11-04 MED ORDER — CLOBETASOL PROPIONATE 0.05 % EX CREA: TOPICAL_CREAM | CUTANEOUS | 0 refills | Status: DC

## 2018-11-04 NOTE — Patient Instructions (Signed)
I value your feedback and entrusting us with your care. If you get a [facility] patient survey, I would appreciate you taking the time to let us know about your experience today. Thank you!  [facility]: [phone]    

## 2018-11-05 ENCOUNTER — Other Ambulatory Visit: Payer: Self-pay | Admitting: Obstetrics and Gynecology

## 2018-11-05 DIAGNOSIS — L900 Lichen sclerosus et atrophicus: Secondary | ICD-10-CM

## 2018-11-07 ENCOUNTER — Telehealth: Payer: Self-pay

## 2018-11-07 NOTE — Telephone Encounter (Signed)
Pt called triage line reporting her cream will be 291 dollars the with the coupon its 90 dollars, is there anything else that can be done or called in so she doesn't have to spend 90 dollars.

## 2018-11-07 NOTE — Telephone Encounter (Signed)
Pls let pt know the pharmacy sent a request for a different, but equal Rx cream, that I approved. Hopefully, it's cheaper. If not, then she'll have to spend the $90 (but it will last a LONG time).

## 2018-11-08 NOTE — Telephone Encounter (Signed)
Pt aware.

## 2019-01-20 ENCOUNTER — Other Ambulatory Visit: Payer: Medicare Other | Attending: Internal Medicine

## 2019-01-25 ENCOUNTER — Ambulatory Visit: Admission: RE | Admit: 2019-01-25 | Payer: Medicare Other | Source: Home / Self Care | Admitting: Internal Medicine

## 2019-01-25 ENCOUNTER — Encounter: Admission: RE | Payer: Self-pay | Source: Home / Self Care

## 2019-01-25 SURGERY — COLONOSCOPY WITH PROPOFOL
Anesthesia: General

## 2019-05-01 ENCOUNTER — Other Ambulatory Visit: Payer: Self-pay | Admitting: Family Medicine

## 2019-05-01 DIAGNOSIS — Z1231 Encounter for screening mammogram for malignant neoplasm of breast: Secondary | ICD-10-CM

## 2019-05-09 ENCOUNTER — Encounter: Payer: Self-pay | Admitting: Urology

## 2019-05-09 ENCOUNTER — Ambulatory Visit (INDEPENDENT_AMBULATORY_CARE_PROVIDER_SITE_OTHER): Payer: Medicare Other | Admitting: Urology

## 2019-05-09 ENCOUNTER — Other Ambulatory Visit: Payer: Self-pay

## 2019-05-09 VITALS — BP 156/77 | HR 80 | Ht 66.0 in | Wt 178.0 lb

## 2019-05-09 DIAGNOSIS — R310 Gross hematuria: Secondary | ICD-10-CM

## 2019-05-09 DIAGNOSIS — D3610 Benign neoplasm of peripheral nerves and autonomic nervous system, unspecified: Secondary | ICD-10-CM

## 2019-05-09 DIAGNOSIS — R3129 Other microscopic hematuria: Secondary | ICD-10-CM | POA: Diagnosis not present

## 2019-05-09 LAB — MICROSCOPIC EXAMINATION: Bacteria, UA: NONE SEEN

## 2019-05-09 LAB — URINALYSIS, COMPLETE
Bilirubin, UA: NEGATIVE
Glucose, UA: NEGATIVE
Ketones, UA: NEGATIVE
Leukocytes,UA: NEGATIVE
Nitrite, UA: NEGATIVE
Protein,UA: NEGATIVE
Specific Gravity, UA: >1.03 — ABNORMAL HIGH (ref 1.005–1.030)
Urobilinogen, Ur: 0.2 mg/dL (ref 0.2–1.0)
pH, UA: 5.5 (ref 5.0–7.5)

## 2019-05-09 NOTE — Patient Instructions (Signed)

## 2019-05-09 NOTE — Progress Notes (Signed)
05/09/2019 4:25 PM   Emily Richard 1950-05-26 [id number]  Referring provider: [name], MD [address] [facility] [place],  [place] [address]  Chief Complaint  Patient presents with  . Hematuria    HPI: 69 year old female who presents today for further evaluation of microscopic hematuria.  She reports that in December, she had an episode of painless gross hematuria.  It was after standing for about 2 hours which she reports was somewhat strenuous.  She had one episode of blood in her urine thereafter.  Urinalysis confirmed by primary care shortly thereafter indicated microscopic hematuria.  This happened on a second occasion after painting.  Second urinalysis also had persistent microscopic blood.  On the past 2 urinalysis performed at her primary care physicians on 02/27/2019 and repeat on 03/23/2019, she has had incidental microscopic blood in the absence of infection.  On the first occasion, she had greater than 50 red blood cells.  On the second occasion, she had 4-10.  He is a never smoker.  She does have a personal history of kidney stones a back to 2012.  She was able to pass spontaneously and never required surgical intervention.  At the time, she had severe flank pain.  She is not experience any kidney stone type pain.  She does endorse some low back pain radiating down the back of her legs.  This is exacerbated by physical activity.  She is buying a new chair to see if that helps with her back pain.  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  This was diagnosed incidentally in 2012.  She is followed by a surgeon in [place] who recommended observation given the lack of interval change as well as concern for postoperative neuropathy.  No significant urinary symptoms at baseline.  She reports that she has remote history of recurrent urinary tract infections was not had an issue with this  in the recent past.  She does believe that she saw urologist in the past around and when the time she had a kidney stone.  She does recall having a cystoscopy but this has been 10+ years.   PMH: Past Medical History:  Diagnosis Date  . Bursitis   . Family history of breast cancer    mother, aunt  . GERD (gastroesophageal reflux disease)   . Hyperlipidemia   . Osteoporosis   . Poor circulation   . Schwannoma   . Shortness of breath     Surgical History: Past Surgical History:  Procedure Laterality Date  . BREAST EXCISIONAL BIOPSY Right 2003   NEG  . COLONOSCOPY  01/2014  . tumor biopsy  09/2010  . VULVA /PERINEUM BIOPSY      Home Medications:  Allergies as of 05/09/2019      Reactions   Penicillins Hives, Itching, Rash   All over body. All over body.      Medication List       Accurate as of May 09, 2019  4:25 PM. If you have any questions, ask your nurse or doctor.        aspirin 81 MG tablet Take 81 mg by mouth daily.   atorvastatin 40 MG tablet Commonly known as: LIPITOR Take 40 mg by mouth daily.   CRANBERRY FRUIT PO Take by mouth.   denosumab 60 MG/ML Soln injection Commonly known as: PROLIA Inject into the skin.   halobetasol 0.05 % cream Commonly known as: ULTRAVATE AAA sparingly  3-4 times weekly for maintenance   meloxicam 7.5 MG tablet Commonly known as: Mobic Take 1 tablet (7.5 mg total) by mouth daily. For pain.   multivitamin-iron-minerals-folic acid chewable tablet Chew 1 tablet by mouth daily.   omeprazole 20 MG capsule Commonly known as: PRILOSEC   Vitamin D-1000 Max St 25 MCG (1000 UT) tablet Generic drug: Cholecalciferol Take by mouth.       Allergies:  Allergies  Allergen Reactions  . Penicillins Hives, Itching and Rash    All over body. All over body.    Family History: Family History  Problem Relation Age of Onset  . Other Mother        left mastectomy 1998  . Osteoporosis Mother   . Breast cancer Mother  73  . Uterine cancer Mother 75  . Breast cancer Maternal Aunt 57    Social History:  reports that she has never smoked. She has never used smokeless tobacco. She reports that she does not drink alcohol or use drugs.  ROS: UROLOGY Frequent Urination?: No Hard to postpone urination?: No Burning/pain with urination?: No Get up at night to urinate?: No Leakage of urine?: No Urine stream starts and stops?: No Trouble starting stream?: No Do you have to strain to urinate?: No Blood in urine?: Yes Urinary tract infection?: No Sexually transmitted disease?: No Injury to kidneys or bladder?: No Painful intercourse?: No Weak stream?: No Currently pregnant?: No Vaginal bleeding?: No Last menstrual period?: n  Gastrointestinal Nausea?: No Vomiting?: No Indigestion/heartburn?: No Diarrhea?: No Constipation?: No  Constitutional Fever: No Night sweats?: No Weight loss?: No Fatigue?: No  Skin Skin rash/lesions?: No Itching?: No  Eyes Blurred vision?: No Double vision?: No  Ears/Nose/Throat Sore throat?: No Sinus problems?: No  Hematologic/Lymphatic Swollen glands?: No Easy bruising?: No  Cardiovascular Leg swelling?: No Chest pain?: No  Respiratory Cough?: No Shortness of breath?: No  Endocrine Excessive thirst?: No  Musculoskeletal Back pain?: No Joint pain?: No  Neurological Headaches?: No Dizziness?: No  Psychologic Depression?: No Anxiety?: No  Physical Exam: BP (!) 156/77   Pulse 80   Ht 5\' 6"  (1.676 m)   Wt 178 lb (80.7 kg)   BMI 28.73 kg/m   Constitutional:  Alert and oriented, No acute distress. HEENT: [place] AT, moist mucus membranes.  Trachea midline, no masses. Cardiovascular: No clubbing, cyanosis, or edema. Respiratory: Normal respiratory effort, no increased work of breathing. GI: Abdomen is soft, nontender, nondistended, no abdominal masses Skin: No rashes, bruises or suspicious lesions. Neurologic: Grossly intact, no focal  deficits, moving all 4 extremities. Psychiatric: Normal mood and affect.  Laboratory Data: Lab Results  Component Value Date   WBC 8.4 10/26/2016   HGB 14.3 10/26/2016   HCT 42.1 10/26/2016   MCV 93.3 10/26/2016   PLT 204 10/26/2016    Lab Results  Component Value Date   CREATININE 0.67 10/26/2016   Urinalysis UA today with 3-10 red blood cells per high-power field, otherwise unremarkable. Pertinent Imaging: CT scan from 2018 personally reviewed.  Large retroperitoneal mass measuring approximately 6 x 4 cm in close proximity to the left renal hilum.  Assessment & Plan:    1. Gross hematuria Given the severity of hematuria including gross and microscopic hematuria, would recommend Foley microscopic hematuria evaluation including CT urogram as well as cystoscopy.  Risk and benefits were discussed.  All questions answered. - Urinalysis - CT HEMATURIA WORKUP; Future  2. Microscopic hematuria As above  3. Benign schwannoma History of stable left retroperitoneal schwannoma  dating back to 2012 managed conservatively, biopsy-proven  Unclear whether or not this playing a role in her gross or microscopic hematuria,?  Vascular compression is contributing factor  We will reassess with CT scan as above   Return in about 4 weeks (around 06/06/2019) for cysto.  Hollice Espy, MD  [facility] [address] [place], [place] [address] [phone]

## 2019-05-12 ENCOUNTER — Encounter: Payer: Self-pay | Admitting: Urology

## 2019-05-22 ENCOUNTER — Encounter: Payer: Self-pay | Admitting: Urology

## 2019-05-24 ENCOUNTER — Ambulatory Visit
Admission: RE | Admit: 2019-05-24 | Discharge: 2019-05-24 | Disposition: A | Discharge status: Home / Self Care | Payer: Medicare Other | Source: Ambulatory Visit | Attending: Urology | Admitting: Urology

## 2019-05-24 ENCOUNTER — Other Ambulatory Visit: Payer: Self-pay

## 2019-05-24 DIAGNOSIS — R310 Gross hematuria: Secondary | ICD-10-CM | POA: Insufficient documentation

## 2019-05-24 LAB — POCT I-STAT CREATININE: Creatinine, Ser: 0.7 mg/dL (ref 0.44–1.00)

## 2019-05-24 MED ORDER — IOHEXOL 300 MG/ML  SOLN
125.0000 mL | Freq: Once | INTRAMUSCULAR | Status: AC | PRN
  Administered 2019-05-24: 125 mL via INTRAVENOUS

## 2019-05-25 ENCOUNTER — Telehealth: Payer: Self-pay | Admitting: *Deleted

## 2019-05-25 ENCOUNTER — Encounter: Payer: Self-pay | Admitting: Urology

## 2019-05-25 MED ORDER — TAMSULOSIN HCL 0.4 MG PO CAPS: 0.4000 mg | ORAL_CAPSULE | Freq: Every day | ORAL | 1 refills | Status: DC

## 2019-05-25 NOTE — Telephone Encounter (Addendum)
Patient informed, RX sent in to pharmacy as requested. Voiced understanding.   ----- Message from Hollice Espy, MD sent at 05/25/2019 10:47 AM EST ----- Looks like you are passing a small kidney stone.  Please call in Flomax and review precautions.    Hollice Espy, MD

## 2019-05-26 ENCOUNTER — Other Ambulatory Visit: Payer: Self-pay | Admitting: Urology

## 2019-05-26 ENCOUNTER — Encounter: Payer: Self-pay | Admitting: Urology

## 2019-05-26 MED ORDER — HYDROCODONE-ACETAMINOPHEN 5-325 MG PO TABS: 1.0000 | ORAL_TABLET | Freq: Four times a day (QID) | ORAL | 0 refills | Status: DC | PRN

## 2019-05-31 ENCOUNTER — Other Ambulatory Visit: Payer: Self-pay | Admitting: Urology

## 2019-05-31 ENCOUNTER — Encounter: Payer: Self-pay | Admitting: Urology

## 2019-05-31 DIAGNOSIS — R3129 Other microscopic hematuria: Secondary | ICD-10-CM

## 2019-05-31 DIAGNOSIS — N201 Calculus of ureter: Secondary | ICD-10-CM

## 2019-06-02 ENCOUNTER — Ambulatory Visit
Admission: RE | Admit: 2019-06-02 | Discharge: 2019-06-02 | Disposition: A | Discharge status: Home / Self Care | Payer: Medicare Other | Source: Ambulatory Visit | Attending: Urology | Admitting: Urology

## 2019-06-02 ENCOUNTER — Other Ambulatory Visit: Payer: Self-pay

## 2019-06-02 DIAGNOSIS — N201 Calculus of ureter: Secondary | ICD-10-CM | POA: Insufficient documentation

## 2019-06-06 ENCOUNTER — Ambulatory Visit: Payer: Medicare Other | Admitting: Urology

## 2019-06-06 ENCOUNTER — Ambulatory Visit (INDEPENDENT_AMBULATORY_CARE_PROVIDER_SITE_OTHER): Payer: Medicare Other | Admitting: Urology

## 2019-06-06 ENCOUNTER — Encounter: Payer: Self-pay | Admitting: Urology

## 2019-06-06 ENCOUNTER — Other Ambulatory Visit: Payer: Self-pay

## 2019-06-06 VITALS — BP 126/77 | HR 88 | Ht 66.0 in | Wt 178.0 lb

## 2019-06-06 DIAGNOSIS — N200 Calculus of kidney: Secondary | ICD-10-CM

## 2019-06-06 DIAGNOSIS — D3610 Benign neoplasm of peripheral nerves and autonomic nervous system, unspecified: Secondary | ICD-10-CM

## 2019-06-06 DIAGNOSIS — R3129 Other microscopic hematuria: Secondary | ICD-10-CM

## 2019-06-06 DIAGNOSIS — N201 Calculus of ureter: Secondary | ICD-10-CM

## 2019-06-06 NOTE — Progress Notes (Signed)
   06/06/19  CC:  Chief Complaint  Patient presents with  . Cysto    HPI: 69 year old female with microscopic hematuria who presents today for cystoscopy.  She also had an episode of painless gross hematuria.  She underwent CT hematuria evaluation on 05/24/2019 which showed a 4 mm stone at the right UVJ with mild right hydroureteronephrosis.  She also had a 8 mm left lower pole nonobstructing stone.  Stable left retroperitoneal schwannoma was also appreciated.  CT was personally reviewed today.  She has been taking Flomax since her CT hematuria evaluation.  She is having some pain in her low back/piriformis area rating down the back of her leg.  To rule out stone as the etiology, she underwent a follow-up renal ultrasound on 06/02/2019 shows resolution of her hydronephrosis.  She never saw the stone pass.  Blood pressure 126/77, pulse 88, height 5\' 6"  (1.676 m), weight 178 lb (80.7 kg). NED. A&Ox3.   No respiratory distress   Abd soft, NT, ND Normal external genitalia with patent urethral meatus  Cystoscopy Procedure Note  Patient identification was confirmed, informed consent was obtained, and patient was prepped using Betadine solution.  Lidocaine jelly was administered per urethral meatus.    Procedure: - Flexible cystoscope introduced, without any difficulty.   - Thorough search of the bladder revealed:    normal urethral meatus    normal urothelium    no stones    no ulcers     no tumors    no urethral polyps    no trabeculation  - Ureteral orifices were normal in position and appearance.  Post-Procedure: - Patient tolerated the procedure well  Assessment/ Plan:  1. Microscopic hematuria Negative cyostoscopy  UA today is clear, gross and microscopic likely secondary to recent stone episode - Urinalysis, Complete  2. Ureteral stone Renal ultrasound shows resolution of her right-sided hydronephrosis, with negative urinalysis and resolution of hydronephrosis,  suggestive interval stone passage  Current pain radiating from her low back likely related to musculoskeletal issues, following with Ortho - Abdomen 1 view (KUB); Future  3. Benign schwannoma Stable on CT scan  4. Kidney stone on left side Incidental nonobstructing 8 mm stone, asymptomatic  Discussed surgical versus observation.  Prefer observation.  Plan for KUB in 1 year to assess for interval growth.  We discussed general stone prevention techniques including drinking plenty water with goal of producing 2.5 L urine daily, increased citric acid intake, avoidance of high oxalate containing foods, and decreased salt intake.  Information about dietary recommendations given today.   Follow-up 1 year with KUB or sooner as needed  Hollice Espy, MD

## 2019-06-07 LAB — MICROSCOPIC EXAMINATION: Bacteria, UA: NONE SEEN

## 2019-06-07 LAB — URINALYSIS, COMPLETE
Bilirubin, UA: NEGATIVE
Glucose, UA: NEGATIVE
Ketones, UA: NEGATIVE
Leukocytes,UA: NEGATIVE
Nitrite, UA: NEGATIVE
Protein,UA: NEGATIVE
Specific Gravity, UA: 1.01 (ref 1.005–1.030)
Urobilinogen, Ur: 0.2 mg/dL (ref 0.2–1.0)
pH, UA: 7 (ref 5.0–7.5)

## 2019-06-12 ENCOUNTER — Ambulatory Visit
Admission: RE | Admit: 2019-06-12 | Discharge: 2019-06-12 | Disposition: A | Discharge status: Home / Self Care | Payer: Medicare Other | Source: Ambulatory Visit | Attending: Family Medicine | Admitting: Family Medicine

## 2019-06-12 DIAGNOSIS — Z1231 Encounter for screening mammogram for malignant neoplasm of breast: Secondary | ICD-10-CM

## 2019-06-16 ENCOUNTER — Other Ambulatory Visit: Payer: Self-pay | Admitting: Urology

## 2019-08-15 ENCOUNTER — Encounter: Payer: Self-pay | Admitting: Urology

## 2019-08-16 ENCOUNTER — Other Ambulatory Visit: Payer: Self-pay

## 2019-08-16 ENCOUNTER — Ambulatory Visit
Admission: RE | Admit: 2019-08-16 | Discharge: 2019-08-16 | Disposition: A | Discharge status: Home / Self Care | Payer: Medicare Other | Source: Ambulatory Visit | Attending: Urology | Admitting: Urology

## 2019-08-16 ENCOUNTER — Encounter: Payer: Self-pay | Admitting: Urology

## 2019-08-16 ENCOUNTER — Ambulatory Visit (INDEPENDENT_AMBULATORY_CARE_PROVIDER_SITE_OTHER): Payer: Medicare Other | Admitting: Urology

## 2019-08-16 VITALS — BP 130/77 | HR 74 | Ht 66.0 in | Wt 175.0 lb

## 2019-08-16 DIAGNOSIS — R310 Gross hematuria: Secondary | ICD-10-CM | POA: Diagnosis not present

## 2019-08-16 DIAGNOSIS — N201 Calculus of ureter: Secondary | ICD-10-CM

## 2019-08-16 DIAGNOSIS — N3001 Acute cystitis with hematuria: Secondary | ICD-10-CM | POA: Diagnosis not present

## 2019-08-16 DIAGNOSIS — M545 Low back pain, unspecified: Secondary | ICD-10-CM

## 2019-08-16 DIAGNOSIS — R3129 Other microscopic hematuria: Secondary | ICD-10-CM

## 2019-08-16 MED ORDER — SULFAMETHOXAZOLE-TRIMETHOPRIM 800-160 MG PO TABS: 1.0000 | ORAL_TABLET | Freq: Two times a day (BID) | ORAL | 0 refills | Status: DC

## 2019-08-16 NOTE — Progress Notes (Signed)
08/16/2019 9:22 PM   Emily Richard [birth year] [id number]  Referring provider: [name], MD [address] [facility] [place],  [place] [address]  Chief Complaint  Patient presents with  . Nephrolithiasis    HPI: Emily Richard is a 69 year old female with a history of hematuria, a left retroperitoneal schwannoma and nephrolithiasis who presents with pelvic pressure, gross hematuria and nausea.  She states that she has been experiencing gross hematuria, nausea, urgency and pelvic pressure that has worsened this morning.  She is also experiencing associated frequency, dysuria and right sided back pain.  She is concerned that she may have another kidney stone.  Her UA today was amber cloudy, specific gravity 1.015, 3+ blood, pH 6.0, 1+ protein, 1+ leukocyte, greater than 30 WBCs, greater than 30 RBCs and 0-10 epithelial cells.  KUB 08/16/2019 noted stable left calculus.  PMH: Past Medical History:  Diagnosis Date  . Bursitis   . Family history of breast cancer    mother, aunt  . GERD (gastroesophageal reflux disease)   . Hyperlipidemia   . Osteoporosis   . Poor circulation   . Schwannoma   . Shortness of breath     Surgical History: Past Surgical History:  Procedure Laterality Date  . BREAST EXCISIONAL BIOPSY Right 2003   NEG  . COLONOSCOPY  01/2014  . tumor biopsy  09/2010  . VULVA /PERINEUM BIOPSY      Home Medications:  Allergies as of 08/16/2019      Reactions   Penicillins Hives, Itching, Rash   All over body. All over body.      Medication List       Accurate as of August 16, 2019 11:59 PM. If you have any questions, ask your nurse or doctor.        aspirin 81 MG tablet Take 81 mg by mouth daily.   atorvastatin 40 MG tablet Commonly known as: LIPITOR Take 40 mg by mouth daily.   denosumab 60 MG/ML Soln injection Commonly known as: PROLIA Inject into the skin.   halobetasol 0.05 % cream Commonly known as: ULTRAVATE AAA sparingly  3-4 times weekly for maintenance   HYDROcodone-acetaminophen 5-325 MG tablet Commonly known as: NORCO/VICODIN Take 1-2 tablets by mouth every 6 (six) hours as needed for moderate pain.   omeprazole 20 MG capsule Commonly known as: PRILOSEC   sulfamethoxazole-trimethoprim 800-160 MG tablet Commonly known as: BACTRIM DS Take 1 tablet by mouth every 12 (twelve) hours. Started by: Zara Council, PA-C   tamsulosin 0.4 MG Caps capsule Commonly known as: FLOMAX TAKE 1 CAPSULE BY MOUTH EVERY DAY   Vitamin D-1000 Max St 25 MCG (1000 UT) tablet Generic drug: Cholecalciferol Take by mouth.       Allergies:  Allergies  Allergen Reactions  . Penicillins Hives, Itching and Rash    All over body. All over body.    Family History: Family History  Problem Relation Age of Onset  . Other Mother        left mastectomy 1998  . Osteoporosis Mother   . Breast cancer Mother 35  . Uterine cancer Mother 48  . Breast cancer Maternal Aunt 57    Social History:  reports that she has never smoked. She has never used smokeless tobacco. She reports that she does not drink alcohol or use drugs.  ROS: Pertinent ROS in HPI  Physical Exam: BP 130/77   Pulse 74   Ht 5\' 6"  (1.676 m)   Wt  175 lb (79.4 kg)   BMI 28.25 kg/m   Constitutional:  Well nourished. Alert and oriented, No acute distress. HEENT: [place] AT, mask in place.  Trachea midline. Cardiovascular: No clubbing, cyanosis, or edema. Respiratory: Normal respiratory effort, no increased work of breathing. Neurologic: Grossly intact, no focal deficits, moving all 4 extremities. Psychiatric: Normal mood and affect.  Laboratory Data: Lab Results  Component Value Date   WBC 8.4 10/26/2016   HGB 14.3 10/26/2016   HCT 42.1 10/26/2016   MCV 93.3 10/26/2016   PLT 204 10/26/2016    Lab Results  Component Value Date   CREATININE 0.70 05/24/2019    Urinalysis Component     Latest Ref Rng & Units 08/16/2019  Specific Gravity, UA      1.005 - 1.030 1.015  pH, UA     5.0 - 7.5 6.0  Color, UA     Yellow Amber (A)  Appearance Ur     Clear Cloudy (A)  Leukocytes,UA     Negative 1+ (A)  Protein,UA     Negative/Trace 1+ (A)  Glucose, UA     Negative Negative  Ketones, UA     Negative Negative  RBC, UA     Negative 3+ (A)  Bilirubin, UA     Negative Negative  Urobilinogen, Ur     0.2 - 1.0 mg/dL 0.2  Nitrite, UA     Negative Negative  Microscopic Examination      See below:   Component     Latest Ref Rng & Units 08/16/2019  WBC, UA     0 - 5 /hpf >30 (A)  RBC     0 - 2 /hpf >30 (A)  Epithelial Cells (non renal)     0 - 10 /hpf 0-10  Bacteria, UA     None seen/Few None seen    I have reviewed the labs.   Pertinent Imaging: CLINICAL DATA:  Acute right flank pain.  EXAM: ABDOMEN - 1 VIEW  COMPARISON:  May 24, 2019.  FINDINGS: The bowel gas pattern is normal. Stable left renal calculus is noted.  IMPRESSION: Stable left renal calculus. No evidence of bowel obstruction or ileus.   Electronically Signed   By: Marijo Conception M.D.   On: 08/16/2019 15:43 I have independently reviewed the films and note the left renal calculus.   Assessment & Plan:    1. Right sided back pain KUB does not identify right sided stones Offered CT Renal stone study, but she deferred at this time stating she would like to treat the presumptive UTI and see if she improves  2. UTI UA with pyuria and microscopic hematuria Urine is sent for culture Septra DS, twice daily x7 days to start empirically, will adjust to culture appropriate antibiotic if necessary once urine culture results are available  3. Gross hematuria If urine culture returns positive for infection, gross hematuria is likely secondary to UTI If urine culture returns negative and patient remains symptomatic may need to pursue CT renal stone study   Return for pending urine culture results .  These notes generated with voice  recognition software. I apologize for typographical errors.  Zara Council, PA-C  [facility] [address]  [place],  [address] [phone]

## 2019-08-17 ENCOUNTER — Telehealth: Payer: Self-pay | Admitting: Urology

## 2019-08-17 LAB — URINALYSIS, COMPLETE
Bilirubin, UA: NEGATIVE
Glucose, UA: NEGATIVE
Ketones, UA: NEGATIVE
Nitrite, UA: NEGATIVE
Specific Gravity, UA: 1.015 (ref 1.005–1.030)
Urobilinogen, Ur: 0.2 mg/dL (ref 0.2–1.0)
pH, UA: 6 (ref 5.0–7.5)

## 2019-08-17 LAB — MICROSCOPIC EXAMINATION
Bacteria, UA: NONE SEEN
RBC, Urine: >30 /hpf — AB (ref 0–2)
WBC, UA: >30 /hpf — AB (ref 0–5)

## 2019-08-17 NOTE — Telephone Encounter (Signed)
Please have Emily Richard drop off an urine as it was not sent for culture yesterday.

## 2019-08-17 NOTE — Telephone Encounter (Signed)
LMOM asking patient to come and leave another urine so we could send it for culture.

## 2019-08-18 ENCOUNTER — Other Ambulatory Visit: Payer: Self-pay

## 2019-08-18 ENCOUNTER — Other Ambulatory Visit: Payer: Medicare Other

## 2019-08-18 DIAGNOSIS — R3129 Other microscopic hematuria: Secondary | ICD-10-CM

## 2019-08-22 LAB — CULTURE, URINE COMPREHENSIVE

## 2019-08-23 ENCOUNTER — Telehealth: Payer: Self-pay | Admitting: Family Medicine

## 2019-08-23 ENCOUNTER — Other Ambulatory Visit: Payer: Self-pay | Admitting: Family Medicine

## 2019-08-23 DIAGNOSIS — R3129 Other microscopic hematuria: Secondary | ICD-10-CM

## 2019-08-23 NOTE — Telephone Encounter (Signed)
Patient notified and she states the Septra has helped.

## 2019-08-23 NOTE — Telephone Encounter (Signed)
-----   Message from Nori Riis, PA-C sent at 08/23/2019  7:51 AM EDT ----- Please let Mrs. Gallegos know that her urine culture is positive, but we typically prescribe a PCN for this bacteria.  If she is feeling better, she should finish the Septra DS.  If she she is still having symptoms, we will need to prescribe a different antibiotic keeping in mind that she is allergic to PCN.

## 2019-08-23 NOTE — Telephone Encounter (Signed)
Patient notified and appointment made for UA.

## 2019-08-23 NOTE — Telephone Encounter (Signed)
Great!  I do need to check on her urine in one month to make sure the microscopic blood has cleared from the urine after being treated for an infection.

## 2019-08-28 ENCOUNTER — Other Ambulatory Visit: Payer: Self-pay

## 2019-08-28 ENCOUNTER — Encounter: Payer: Self-pay | Admitting: Urology

## 2019-08-28 ENCOUNTER — Ambulatory Visit (INDEPENDENT_AMBULATORY_CARE_PROVIDER_SITE_OTHER): Payer: Medicare Other | Admitting: Physician Assistant

## 2019-08-28 ENCOUNTER — Other Ambulatory Visit (INDEPENDENT_AMBULATORY_CARE_PROVIDER_SITE_OTHER): Payer: Self-pay | Admitting: Nurse Practitioner

## 2019-08-28 ENCOUNTER — Encounter: Payer: Self-pay | Admitting: Physician Assistant

## 2019-08-28 VITALS — BP 125/75 | HR 70 | Ht 66.0 in | Wt 172.0 lb

## 2019-08-28 DIAGNOSIS — R52 Pain, unspecified: Secondary | ICD-10-CM

## 2019-08-28 DIAGNOSIS — R310 Gross hematuria: Secondary | ICD-10-CM | POA: Diagnosis not present

## 2019-08-28 DIAGNOSIS — R0989 Other specified symptoms and signs involving the circulatory and respiratory systems: Secondary | ICD-10-CM

## 2019-08-28 LAB — MICROSCOPIC EXAMINATION
Bacteria, UA: NONE SEEN
RBC, Urine: >30 /hpf — AB (ref 0–2)

## 2019-08-28 LAB — URINALYSIS, COMPLETE
Bilirubin, UA: NEGATIVE
Leukocytes,UA: NEGATIVE
Nitrite, UA: NEGATIVE
Specific Gravity, UA: 1.02 (ref 1.005–1.030)
Urobilinogen, Ur: 1 mg/dL (ref 0.2–1.0)
pH, UA: 7 (ref 5.0–7.5)

## 2019-08-28 NOTE — Patient Instructions (Signed)
Call in one week for symptom recheck

## 2019-08-28 NOTE — Telephone Encounter (Signed)
Spoke to patient and made an appointment for her to be seen in office today.

## 2019-08-28 NOTE — Progress Notes (Signed)
08/28/2019 12:00 PM   Emily Richard May 28, 1950 [id number]  CC: Gross hematuria  HPI: Emily Richard is a 69 y.o. female with PMH nephrolithiasis and painless gross hematuria who presents today for evaluation of gross hematuria.  She underwent CT hematuria on 05/24/2019 with findings of an 8 mm nonobstructing left renal stone as well as mild right hydroureteronephrosis with a 4 mm right UVJ stone and a stable left retroperitoneal schwannoma.  Follow-up renal ultrasound on 06/02/2019 revealed resolution of right hydroureteronephrosis, however patient had not seen a stone pass.  She subsequently underwent cystoscopy with Dr. Erlene Quan on 06/06/2019 with no significant findings.  Patient was subsequently treated for UTI by Zara Council.  Urine culture on 08/18/2019 revealed Strep anginosus.  She completed Bactrim DS twice daily x7 days for treatment of this.  She does have penicillin allergy.  Today, patient reports her urine cleared following completion of Bactrim. However, she reports that her gross hematuria returned 1 day ago. It was precipitated by heavy housework the day prior. She states she has had multiple episodes of gross hematuria following heavier than normal physical activity.  She denies dysuria, flank pain, fever, chills, nausea, and vomiting.  In-office UA today positive for 3+ blood and 2+ protein; urine microscopy with >30 RBCs/HPF.  PMH: Past Medical History:  Diagnosis Date  . Bursitis   . Family history of breast cancer    mother, aunt  . GERD (gastroesophageal reflux disease)   . Hyperlipidemia   . Osteoporosis   . Poor circulation   . Schwannoma   . Shortness of breath     Surgical History: Past Surgical History:  Procedure Laterality Date  . BREAST EXCISIONAL BIOPSY Right 2003   NEG  . COLONOSCOPY  01/2014  . tumor biopsy  09/2010  . VULVA /PERINEUM BIOPSY      Home Medications:  Allergies as of 08/28/2019      Reactions   Penicillins Hives,  Itching, Rash   All over body. All over body.      Medication List       Accurate as of Aug 28, 2019 12:00 PM. If you have any questions, ask your nurse or doctor.        aspirin 81 MG tablet Take 81 mg by mouth daily.   atorvastatin 40 MG tablet Commonly known as: LIPITOR Take 40 mg by mouth daily.   denosumab 60 MG/ML Soln injection Commonly known as: PROLIA Inject into the skin.   halobetasol 0.05 % cream Commonly known as: ULTRAVATE AAA sparingly 3-4 times weekly for maintenance   HYDROcodone-acetaminophen 5-325 MG tablet Commonly known as: NORCO/VICODIN Take 1-2 tablets by mouth every 6 (six) hours as needed for moderate pain.   omeprazole 20 MG capsule Commonly known as: PRILOSEC   sulfamethoxazole-trimethoprim 800-160 MG tablet Commonly known as: BACTRIM DS Take 1 tablet by mouth every 12 (twelve) hours.   tamsulosin 0.4 MG Caps capsule Commonly known as: FLOMAX TAKE 1 CAPSULE BY MOUTH EVERY DAY   Vitamin D-1000 Max St 25 MCG (1000 UT) tablet Generic drug: Cholecalciferol Take by mouth.       Allergies:  Allergies  Allergen Reactions  . Penicillins Hives, Itching and Rash    All over body. All over body.    Family History: Family History  Problem Relation Age of Onset  . Other Mother        left mastectomy 1998  . Osteoporosis Mother   . Breast cancer Mother 13  . Uterine cancer  Mother 72  . Breast cancer Maternal Aunt 57    Social History:   reports that she has never smoked. She has never used smokeless tobacco. She reports that she does not drink alcohol or use drugs.  Physical Exam: BP 125/75   Pulse 70   Ht 5\' 6"  (1.676 m)   Wt 172 lb (78 kg)   BMI 27.76 kg/m   Constitutional:  Alert and oriented, no acute distress, nontoxic appearing HEENT: [place], AT Cardiovascular: No clubbing, cyanosis, or edema Respiratory: Normal respiratory effort, no increased work of breathing Skin: No rashes, bruises or suspicious lesions Neurologic:  Grossly intact, no focal deficits, moving all 4 extremities Psychiatric: Normal mood and affect  Laboratory Data: Results for orders placed or performed in visit on 08/28/19  Microscopic Examination   URINE  Result Value Ref Range   WBC, UA 0-5 0 - 5 /hpf   RBC >30 (A) 0 - 2 /hpf   Epithelial Cells (non renal) 0-10 0 - 10 /hpf   Bacteria, UA None seen None seen/Few  Urinalysis, Complete  Result Value Ref Range   Specific Gravity, UA 1.020 1.005 - 1.030   pH, UA 7.0 5.0 - 7.5   Color, UA Red (A) Yellow   Appearance Ur Cloudy (A) Clear   Leukocytes,UA Negative Negative   Protein,UA 2+ (A) Negative/Trace   Glucose, UA Trace (A) Negative   Ketones, UA Trace (A) Negative   RBC, UA 3+ (A) Negative   Bilirubin, UA Negative Negative   Urobilinogen, Ur 1.0 0.2 - 1.0 mg/dL   Nitrite, UA Negative Negative   Microscopic Examination See below:    Assessment & Plan:   1. Gross hematuria 69 year old female with a history of gross hematuria having completed work-up for this earlier this year presents with another episode of gross hematuria. Of note, patient reports several of these episodes have been precipitated by increased physical activity.  Differential at this time includes acute cystitis versus nephrolithiasis versus exercise-induced hematuria. UA today reassuring for infection, will send for culture to confirm. I counseled the patient to avoid more strenuous than normal physical activity over the next week and to contact our office 1 week from today to report if her [facility] has resolved in an attempt to establish a pattern. She expressed understanding. - Urinalysis, Complete - CULTURE, URINE COMPREHENSIVE - Microscopic Examination   Return for Patient to call in 1 week for symptom update.  Debroah Loop, PA-C  [facility] [address] [place], [place] [address] [phone]

## 2019-08-29 ENCOUNTER — Ambulatory Visit (INDEPENDENT_AMBULATORY_CARE_PROVIDER_SITE_OTHER): Payer: Medicare Other | Admitting: Nurse Practitioner

## 2019-08-29 ENCOUNTER — Encounter (INDEPENDENT_AMBULATORY_CARE_PROVIDER_SITE_OTHER): Payer: Self-pay | Admitting: Nurse Practitioner

## 2019-08-29 ENCOUNTER — Ambulatory Visit (INDEPENDENT_AMBULATORY_CARE_PROVIDER_SITE_OTHER): Payer: Medicare Other

## 2019-08-29 VITALS — BP 109/72 | HR 66 | Resp 16 | Ht 66.0 in | Wt 177.0 lb

## 2019-08-29 DIAGNOSIS — R0989 Other specified symptoms and signs involving the circulatory and respiratory systems: Secondary | ICD-10-CM

## 2019-08-29 DIAGNOSIS — R52 Pain, unspecified: Secondary | ICD-10-CM

## 2019-08-29 DIAGNOSIS — I83893 Varicose veins of bilateral lower extremities with other complications: Secondary | ICD-10-CM

## 2019-08-30 LAB — CULTURE, URINE COMPREHENSIVE

## 2019-09-01 ENCOUNTER — Telehealth: Payer: Self-pay | Admitting: Physician Assistant

## 2019-09-01 ENCOUNTER — Encounter (INDEPENDENT_AMBULATORY_CARE_PROVIDER_SITE_OTHER): Payer: Self-pay | Admitting: Nurse Practitioner

## 2019-09-01 MED ORDER — NITROFURANTOIN MONOHYD MACRO 100 MG PO CAPS: 100.0000 mg | ORAL_CAPSULE | Freq: Two times a day (BID) | ORAL | 0 refills | Status: AC

## 2019-09-01 NOTE — Telephone Encounter (Signed)
Notified patient as advised. Patient verbalized understanding.  

## 2019-09-01 NOTE — Progress Notes (Signed)
Subjective:    Patient ID: Emily Richard, female    DOB: 1950/05/23, 68 y.o.   MRN: [id number] Chief Complaint  Patient presents with  . New Patient (Initial Visit)    ref Emily Richard for varicose veins w/pain    Emily Richard is a 69 year old female that presents today for follow-up regarding painful varicose veins.  The patient is referred by Dr. Kary Richard.  The patient notes that she has had some discoloration and tenderness in the left lower leg above the ankle.  The patient has been wearing some compression stockings that have helped somewhat.  One of the patient's greatest concerns is for possible blood clot or DVT.   The patient relates burning and stinging which worsened steadily throughout the course of the day, particularly with standing. The patient also notes an aching and throbbing pain over the varicosities, particularly with prolonged dependent positions. The symptoms are significantly improved with elevation.  There is also noted that the swelling is typically worse during the end of the day.    There is no history of DVT, PE or superficial thrombophlebitis. There is no history of ulceration or hemorrhage. The patient denies a significant family history of varicose veins. OB history: G2 P2   The patient has  worn graduated compression in the past. At the present time the patient has been using over-the-counter analgesics. There is no history of prior surgical intervention or sclerotherapy.  Today noninvasive studies show evidence of venous reflux in the great saphenous vein at the saphenofemoral junction extending to the proximal calf.  The vein diameters measure from 0.39 cm to 0.64 cm.  There is no evidence of DVT or superficial venous thrombosis in the left lower extremity.  There is no evidence of deep venous insufficiency noted in the left lower extremity.   Review of Systems  Cardiovascular: Positive for leg swelling.       Objective:   Physical Exam Vitals  reviewed.  Constitutional:      Appearance: Normal appearance.  HENT:     Head: Normocephalic.  Cardiovascular:     Rate and Rhythm: Normal rate and regular rhythm.     Pulses: Normal pulses.  Musculoskeletal:     Left lower leg: Edema present.  Neurological:     Mental Status: She is alert and oriented to person, place, and time.  Psychiatric:        Mood and Affect: Mood normal.        Behavior: Behavior normal.        Thought Content: Thought content normal.        Judgment: Judgment normal.     BP 109/72 (BP Location: Right Arm)   Pulse 66   Resp 16   Ht 5\' 6"  (1.676 m)   Wt 177 lb (80.3 kg)   BMI 28.57 kg/m   Past Medical History:  Diagnosis Date  . Bursitis   . Family history of breast cancer    mother, aunt  . GERD (gastroesophageal reflux disease)   . Hyperlipidemia   . Osteoporosis   . Poor circulation   . Schwannoma   . Shortness of breath     Social History   Socioeconomic History  . Marital status: Married    Spouse name: Not on file  . Number of children: Not on file  . Years of education: Not on file  . Highest education level: Not on file  Occupational History  . Not on file  Tobacco Use  . Smoking  status: Never Smoker  . Smokeless tobacco: Never Used  Substance and Sexual Activity  . Alcohol use: No  . Drug use: No  . Sexual activity: Not Currently    Birth control/protection: Post-menopausal  Other Topics Concern  . Not on file  Social History Narrative  . Not on file   Social Determinants of Health   Financial Resource Strain:   . Difficulty of Paying Living Expenses:   Food Insecurity:   . Worried About Charity fundraiser in the Last Year:   . Arboriculturist in the Last Year:   Transportation Needs:   . Film/video editor (Medical):   Marland Kitchen Lack of Transportation (Non-Medical):   Physical Activity:   . Days of Exercise per Week:   . Minutes of Exercise per Session:   Stress:   . Feeling of Stress :   Social  Connections:   . Frequency of Communication with Friends and Family:   . Frequency of Social Gatherings with Friends and Family:   . Attends Religious Services:   . Active Member of Clubs or Organizations:   . Attends Archivist Meetings:   Marland Kitchen Marital Status:   Intimate Partner Violence:   . Fear of Current or Ex-Partner:   . Emotionally Abused:   Marland Kitchen Physically Abused:   . Sexually Abused:     Past Surgical History:  Procedure Laterality Date  . BREAST EXCISIONAL BIOPSY Right 2003   NEG  . COLONOSCOPY  01/2014  . tumor biopsy  09/2010  . VULVA /PERINEUM BIOPSY      Family History  Problem Relation Age of Onset  . Other Mother        left mastectomy 1998  . Osteoporosis Mother   . Breast cancer Mother 56  . Uterine cancer Mother 58  . Breast cancer Maternal Aunt 57    Allergies  Allergen Reactions  . Penicillins Hives, Itching and Rash    All over body. All over body.       Assessment & Plan:   1. Varicose veins of bilateral lower extremities with other complications  Recommend:  The patient has large symptomatic varicose veins that are painful and associated with swelling.  I have had a long discussion with the patient regarding  varicose veins and why they cause symptoms.  Patient will begin wearing graduated compression stockings class 1 on a daily basis, beginning first thing in the morning and removing them in the evening. The patient is instructed specifically not to sleep in the stockings.    The patient  will also begin using over-the-counter analgesics such as Motrin 600 mg po TID to help control the symptoms.    In addition, behavioral modification including elevation during the day will be initiated.    Pending the results of these changes the  patient will be reevaluated in three months.    Further plans will be based on the ultrasound results and whether conservative therapies are successful at eliminating the pain and swelling.    Current  Outpatient Medications on File Prior to Visit  Medication Sig Dispense Refill  . aspirin 81 MG tablet Take 81 mg by mouth daily.      Marland Kitchen atorvastatin (LIPITOR) 40 MG tablet Take 40 mg by mouth daily.  11  . cetirizine (ZYRTEC) 10 MG tablet Take 10 mg by mouth daily.    . Cholecalciferol (VITAMIN D-1000 MAX ST) 1000 units tablet Take by mouth.    . denosumab (PROLIA) 60  MG/ML SOLN injection Inject into the skin.    . halobetasol (ULTRAVATE) 0.05 % cream AAA sparingly 3-4 times weekly for maintenance 50 g 1  . HYDROcodone-acetaminophen (NORCO/VICODIN) 5-325 MG tablet Take 1-2 tablets by mouth every 6 (six) hours as needed for moderate pain. 5 tablet 0  . omeprazole (PRILOSEC) 20 MG capsule     . tamsulosin (FLOMAX) 0.4 MG CAPS capsule TAKE 1 CAPSULE BY MOUTH EVERY DAY 30 capsule 1  . sulfamethoxazole-trimethoprim (BACTRIM DS) 800-160 MG tablet Take 1 tablet by mouth every 12 (twelve) hours. (Patient not taking: Reported on 08/29/2019) 14 tablet 0   Current Facility-Administered Medications on File Prior to Visit  Medication Dose Route Frequency Provider Last Rate Last Admin  . triamcinolone acetonide (KENALOG) 10 MG/ML injection 10 mg  10 mg Other Once Landis Martins, DPM        There are no Patient Instructions on file for this visit. No follow-ups on file.   Kris Hartmann, NP

## 2019-09-01 NOTE — Telephone Encounter (Signed)
Please contact the patient and inform her that her urine culture grew a small amount of bacteria.  While I am not sure that this represents a true infection, I would like to treat her with antibiotics to rule this out as contributory to her gross hematuria.  I have sent a prescription for Macrobid 100 mg twice daily x5 days to the [facility] in [facility]

## 2019-09-21 ENCOUNTER — Encounter: Payer: Self-pay | Admitting: Urology

## 2019-09-21 NOTE — Telephone Encounter (Signed)
Spoke with patient, hematuria started yesterday after working and lifting a lot. Denies pain, dysuria or any other symptoms. Please advise

## 2019-09-21 NOTE — Telephone Encounter (Signed)
Per Debroah Loop, PA continue to monitor symptoms, explained symptoms to watch out for. If symptoms worsen contact the office. Patient voiced understanding.

## 2019-09-26 ENCOUNTER — Other Ambulatory Visit: Payer: Self-pay

## 2019-09-26 ENCOUNTER — Other Ambulatory Visit: Payer: Medicare Other

## 2019-09-26 DIAGNOSIS — R3129 Other microscopic hematuria: Secondary | ICD-10-CM

## 2019-09-26 LAB — URINALYSIS, COMPLETE
Bilirubin, UA: NEGATIVE
Glucose, UA: NEGATIVE
Ketones, UA: NEGATIVE
Nitrite, UA: NEGATIVE
Protein,UA: NEGATIVE
Specific Gravity, UA: 1.02 (ref 1.005–1.030)
Urobilinogen, Ur: 0.2 mg/dL (ref 0.2–1.0)
pH, UA: 7 (ref 5.0–7.5)

## 2019-09-26 LAB — MICROSCOPIC EXAMINATION: Bacteria, UA: NONE SEEN

## 2019-09-29 LAB — CULTURE, URINE COMPREHENSIVE

## 2019-10-15 ENCOUNTER — Telehealth: Payer: Self-pay | Admitting: Urology

## 2019-10-15 NOTE — Telephone Encounter (Signed)
I had set a MyChart message to Emily Richard and I have not heard back from her.  Her urine showed microscopic hematuria and it was sent for culture. The culture is negative. I would like to refer her to nephrology at this time. They would rule out any autoimmune or inflammatory issues with her kidneys.

## 2019-10-16 NOTE — Telephone Encounter (Signed)
LMOM for patient to return call.

## 2019-10-17 NOTE — Telephone Encounter (Signed)
Patient notified and is ok with the referral.

## 2019-11-06 ENCOUNTER — Telehealth: Payer: Self-pay | Admitting: Family Medicine

## 2019-11-06 ENCOUNTER — Other Ambulatory Visit: Payer: Self-pay | Admitting: Urology

## 2019-11-06 DIAGNOSIS — R310 Gross hematuria: Secondary | ICD-10-CM

## 2019-11-06 NOTE — Progress Notes (Signed)
Referral in for nephrology.

## 2019-11-06 NOTE — Telephone Encounter (Signed)
-----   Message from Nori Riis, PA-C sent at 11/05/2019  5:17 PM EDT ----- Would you check and see if Mrs. Kleckley has been contacted by nephrology or been seen by nephrology?

## 2020-01-02 ENCOUNTER — Other Ambulatory Visit: Payer: Self-pay

## 2020-01-02 ENCOUNTER — Ambulatory Visit (INDEPENDENT_AMBULATORY_CARE_PROVIDER_SITE_OTHER): Payer: Medicare Other | Admitting: Vascular Surgery

## 2020-01-02 DIAGNOSIS — I83812 Varicose veins of left lower extremities with pain: Secondary | ICD-10-CM

## 2020-01-02 NOTE — Progress Notes (Signed)
Patient ID: Emily Richard, female   DOB: Jul 29, 1950, 69 y.o.   MRN: [id number]  Chief Complaint  Patient presents with  . Follow-up    4 mo no studies    HPI Emily Richard is a 69 y.o. female.  Patient returns in follow up of their venous disease.  They have done their best to comply with the prescribed conservative therapies of compression stockings, leg elevation, exercise, and still requires anti-inflammatories for discomfort and has symptoms that are persistent and bothersome on a daily basis, affecting their activities of daily living and normal activities.  The symptoms may be slightly improved with compression stockings and elevation, but not dramatically so.  She still has daily pain and swelling.  The venous reflux study demonstrated long segment reflux in the left great saphenous vein up to and including the saphenofemoral junction.   Past Medical History:  Diagnosis Date  . Bursitis   . Family history of breast cancer    mother, aunt  . GERD (gastroesophageal reflux disease)   . Hyperlipidemia   . Osteoporosis   . Poor circulation   . Schwannoma   . Shortness of breath     Past Surgical History:  Procedure Laterality Date  . BREAST EXCISIONAL BIOPSY Right 2003   NEG  . COLONOSCOPY  01/2014  . tumor biopsy  09/2010  . VULVA /PERINEUM BIOPSY      Family History  Problem Relation Age of Onset  . Other Mother        left mastectomy 1998  . Osteoporosis Mother   . Breast cancer Mother 65  . Uterine cancer Mother 33  . Breast cancer Maternal Aunt 57      Social History   Tobacco Use  . Smoking status: Never Smoker  . Smokeless tobacco: Never Used  Vaping Use  . Vaping Use: Never used  Substance Use Topics  . Alcohol use: No  . Drug use: No     Allergies  Allergen Reactions  . Penicillins Hives, Itching and Rash    All over body. All over body.    Current Outpatient Medications  Medication Sig Dispense Refill  . aspirin 81 MG tablet  Take 81 mg by mouth daily.      Marland Kitchen atorvastatin (LIPITOR) 40 MG tablet Take 40 mg by mouth daily.  11  . cetirizine (ZYRTEC) 10 MG tablet Take 10 mg by mouth daily.    . Cholecalciferol (VITAMIN D-1000 MAX ST) 1000 units tablet Take by mouth.    . denosumab (PROLIA) 60 MG/ML SOLN injection Inject into the skin.    Marland Kitchen denosumab (XGEVA) 120 MG/1.7ML SOLN injection Inject into the skin.    . halobetasol (ULTRAVATE) 0.05 % cream AAA sparingly 3-4 times weekly for maintenance 50 g 1  . HYDROcodone-acetaminophen (NORCO/VICODIN) 5-325 MG tablet Take 1-2 tablets by mouth every 6 (six) hours as needed for moderate pain. 5 tablet 0  . omeprazole (PRILOSEC) 20 MG capsule     . sulfamethoxazole-trimethoprim (BACTRIM DS) 800-160 MG tablet Take 1 tablet by mouth every 12 (twelve) hours. 14 tablet 0  . tamsulosin (FLOMAX) 0.4 MG CAPS capsule TAKE 1 CAPSULE BY MOUTH EVERY DAY 30 capsule 1  . tiZANidine (ZANAFLEX) 4 MG tablet Take 4 mg by mouth 3 (three) times daily as needed.     Current Facility-Administered Medications  Medication Dose Route Frequency Provider Last Rate Last Admin  . triamcinolone acetonide (KENALOG) 10 MG/ML injection 10 mg  10 mg Other Once  Landis Martins, DPM          REVIEW OF SYSTEMS (Negative unless checked)  Constitutional: [] Weight loss  [] Fever  [] Chills Cardiac: [] Chest pain   [] Chest pressure   [] Palpitations   [] Shortness of breath when laying flat   [] Shortness of breath at rest   [] Shortness of breath with exertion. Vascular:  [] Pain in legs with walking   [] Pain in legs at rest   [] Pain in legs when laying flat   [] Claudication   [] Pain in feet when walking  [] Pain in feet at rest  [] Pain in feet when laying flat   [] History of DVT   [] Phlebitis   [x] Swelling in legs   [x] Varicose veins   [] Non-healing ulcers Pulmonary:   [] Uses home oxygen   [] Productive cough   [] Hemoptysis   [] Wheeze  [] COPD   [] Asthma Neurologic:  [] Dizziness  [] Blackouts   [] Seizures   [] History of  stroke   [] History of TIA  [] Aphasia   [] Temporary blindness   [] Dysphagia   [] Weakness or numbness in arms   [] Weakness or numbness in legs Musculoskeletal:  [] Arthritis   [] Joint swelling   [] Joint pain   [] Low back pain Hematologic:  [] Easy bruising  [] Easy bleeding   [] Hypercoagulable state   [] Anemic  [] Hepatitis Gastrointestinal:  [] Blood in stool   [] Vomiting blood  [] Gastroesophageal reflux/heartburn   [] Abdominal pain Genitourinary:  [] Chronic kidney disease   [] Difficult urination  [] Frequent urination  [] Burning with urination   [] Hematuria Skin:  [] Rashes   [] Ulcers   [] Wounds Psychological:  [] History of anxiety   []  History of major depression.    Physical Exam BP 119/66   Pulse 68   Ht 5\' 6"  (1.676 m)   Wt 177 lb (80.3 kg)   BMI 28.57 kg/m  Gen:  WD/WN, NAD Head: [place]/AT, No temporalis wasting.  Ear/Nose/Throat: Hearing grossly intact, dentition good Eyes: Sclera non-icteric. Conjunctiva clear Neck: Supple. Trachea midline Pulmonary:  Good air movement, no use of accessory muscles, respirations not labored.  Cardiac: RRR, No JVD Vascular: Varicosities scattered and measuring up to 1 mm in the right lower extremity        Varicosities diffuse and measuring up to 2 mm in the left lower extremity Vessel Right Left  Radial Palpable Palpable                          PT Palpable Palpable  DP Palpable Palpable    Musculoskeletal: M/S 5/5 throughout.   No RLE edema.  Trace LLE edema Neurologic: Sensation grossly intact in extremities.  Symmetrical.  Speech is fluent.  Psychiatric: Judgment intact, Mood & affect appropriate for pt's clinical situation. Dermatologic: No rashes or ulcers noted.  No cellulitis or open wounds.  Radiology No results found.  Labs No results found for this or any previous visit (from the past 2160 hour(s)).  Assessment/Plan:  Varicose veins of leg with pain, left   The patient has done their best to comply with conservative therapy  of 20-30 mm Hg compression stockings, leg elevation, exercise, and anti-inflammatories as needed for discomfort.  Despite this, they continue to have daily and persistent symptoms from their venous disease.  A venous reflux study demonstrates long segment left great saphenous vein reflux including the saphenofemoral junction.  As such, the patient is likely to benefit from endovenous laser ablation of the left great saphenous vein.  Risks and benefits of the procedure including bleeding, infection, recanalization, DVT, and need for  further therapy for residual varicosities were discussed.  The patient voices their understanding and is agreeable to proceed with laser ablation of the left great saphenous vein.     Leotis Pain 01/02/2020, 11:36 AM

## 2020-01-02 NOTE — Patient Instructions (Signed)
Nonsurgical Procedures for Varicose Veins Various nonsurgical procedures can be used to treat varicose veins. Varicose veins are swollen, twisted veins that are visible under the skin. They occur most often in the legs. These veins may appear blue and bulging. Varicose veins are caused by damage to the valves in veins. All veins have a valve that makes blood flow in only one direction. If a valve gets weak or damaged, blood can pool and cause varicose veins. You may need a procedure to treat your varicose veins if they are causing symptoms or complications, or if lifestyle changes have not helped. These procedures can reduce pain, aching, and the risk of bleeding and blood clots. They can also improve the way the affected area looks (cosmetic appearance). The three common nonsurgical procedures are:  Sclerotherapy. A chemical is injected to close off a vein.  Laser treatment. Light energy is applied to close off the vein.  Radiofrequency vein ablation. Electrical energy is used to produce heat that closes off the vein. Your health care provider will discuss the method that is best for you based on your condition. Tell a health care provider about:  Any allergies you have.  All medicines you are taking, including vitamins, herbs, eye drops, creams, and over-the-counter medicines.  Any problems you or family members have had with anesthetic medicines.  Any blood disorders you have.  Any surgeries you have had.  Any medical conditions you have.  Whether you are pregnant or may be pregnant. What are the risks? Generally, this is a safe procedure. However, problems may occur, including:  Damage to nearby nerves, tissues, or veins.  Skin irritation, sores, or dark spots.  Numbness.  Clotting.  Infection.  Allergic reactions to medicines.  Scarring.  Leg swelling.  Need for additional treatments.  Bruising. What happens before the procedure?  Ask your health care provider  about: ? Changing or stopping your regular medicines. This is especially important if you are taking diabetes medicines or blood thinners. ? Taking over-the-counter medicines, vitamins, herbs, and supplements. ? Taking medicines such as aspirin and ibuprofen. These medicines can thin your blood. Do not take these medicines unless your health care provider tells you to take them.  You may have an exam or testing. This can include a tests to: ? Check for clots and check blood flow using sound waves (Doppler ultrasound). ? Observe how blood flows through your veins by injecting a dye that outlines your veins on X-rays (angiogram). This test is used in rare cases. What happens during the procedure? One of the following procedures will be performed: Sclerotherapy This procedure is often used for small to medium veins.  A chemical (sclerosant) that irritates the lining of the vein will be injected into the vein. This will cause the varicose vein to be closed off. Sclerosants in different amounts and strengths can be used, depending on the size and location of the vein.  All of the varicose vein sites will be injected. You may need more than one treatment because new varicose veins may develop, or more than one injection may be needed for each varicose vein.  Laser treatment There are two ways that lasers are used to treat varicose veins:  Light energy from a laser may be directed onto the vein through the skin.  A needle may be used to pass a thin laser catheter into the vein to cause it to close. You may need more than one treatment if the vein re-opens. In some cases,   laser treatment may be combined with sclerotherapy. Radiofrequency vein ablation   You will be given a medicine that numbs the area (local anesthetic).  A small incision will be made near the varicose vein.  A thin tube (catheter) will be threaded into your vein.  The tip of the catheter will deploy electrodes.  The  electrodes will deliver electrical energy to produce heat that closes off the vein. What happens after the procedure?  A bandage (dressing) may be used to cover the injection site or incisions.  You may have to wear compression stockings. These stockings help to prevent blood clots and reduce swelling in your legs.  Return to your normal activities as told by your health care provider. Summary  Varicose veins are swollen, twisted veins that are visible under the skin. They occur most often in the legs.  Various procedures can be used to treat varicose veins. You may need a procedure to treat your varicose veins if they are causing symptoms or complications, or if lifestyle changes have not helped.  Your health care provider will discuss the method that is best for you based on your condition. This information is not intended to replace advice given to you by your health care provider. Make sure you discuss any questions you have with your health care provider. Document Revised: 07/29/2018 Document Reviewed: 07/17/2016 Elsevier Patient Education  2020 Elsevier Inc.  

## 2020-01-03 DIAGNOSIS — R319 Hematuria, unspecified: Secondary | ICD-10-CM | POA: Insufficient documentation

## 2020-01-03 DIAGNOSIS — R809 Proteinuria, unspecified: Secondary | ICD-10-CM | POA: Insufficient documentation

## 2020-02-12 DIAGNOSIS — R768 Other specified abnormal immunological findings in serum: Secondary | ICD-10-CM | POA: Insufficient documentation

## 2020-02-22 ENCOUNTER — Ambulatory Visit
Admission: RE | Admit: 2020-02-22 | Discharge: 2020-02-22 | Disposition: A | Discharge status: Home / Self Care | Payer: Medicare Other | Source: Ambulatory Visit | Attending: Urology | Admitting: Urology

## 2020-02-22 ENCOUNTER — Other Ambulatory Visit: Payer: Self-pay | Admitting: *Deleted

## 2020-02-22 ENCOUNTER — Encounter: Payer: Self-pay | Admitting: Urology

## 2020-02-22 ENCOUNTER — Ambulatory Visit
Admission: RE | Admit: 2020-02-22 | Discharge: 2020-02-22 | Disposition: A | Discharge status: Home / Self Care | Payer: Medicare Other | Attending: Urology | Admitting: Urology

## 2020-02-22 ENCOUNTER — Other Ambulatory Visit: Payer: Self-pay

## 2020-02-22 ENCOUNTER — Ambulatory Visit (INDEPENDENT_AMBULATORY_CARE_PROVIDER_SITE_OTHER): Payer: Medicare Other | Admitting: Urology

## 2020-02-22 VITALS — BP 111/72 | HR 99 | Ht 66.0 in | Wt 177.6 lb

## 2020-02-22 DIAGNOSIS — N201 Calculus of ureter: Secondary | ICD-10-CM | POA: Insufficient documentation

## 2020-02-22 DIAGNOSIS — N390 Urinary tract infection, site not specified: Secondary | ICD-10-CM | POA: Diagnosis not present

## 2020-02-22 DIAGNOSIS — Z87442 Personal history of urinary calculi: Secondary | ICD-10-CM

## 2020-02-22 DIAGNOSIS — N200 Calculus of kidney: Secondary | ICD-10-CM | POA: Diagnosis not present

## 2020-02-22 DIAGNOSIS — R310 Gross hematuria: Secondary | ICD-10-CM

## 2020-02-22 MED ORDER — NITROFURANTOIN MONOHYD MACRO 100 MG PO CAPS: 100.0000 mg | ORAL_CAPSULE | Freq: Two times a day (BID) | ORAL | 0 refills | Status: DC

## 2020-02-22 NOTE — Addendum Note (Signed)
Addended by: Verlene Mayer A on: 02/22/2020 10:01 AM   Modules accepted: Orders

## 2020-02-22 NOTE — Progress Notes (Signed)
   02/22/2020 12:53 PM   Emily Richard [birth year] [id number]  Reason for visit: Left flank pain, dysuria, nausea  HPI: I saw Emily Richard as an add-on in urology clinic for the above acute issues.  She is regularly followed by Dr. Erlene Quan.  Her history is notable for gross hematuria that was worked up by Dr. Erlene Quan in February 2021 and showed a 4 mm right distal ureteral stone that ultimately passed spontaneously.  At that time she also had a 1 cm left lower pole nonobstructive stone, as well as a stable 5 cm left retroperitoneal schwannoma.  She reports 1 week of some left flank and groin pain especially with physical activity, dysuria, nausea, as well as possible gross hematuria during that time.  She denies any fevers or chills.  I personally reviewed her KUB today that shows a stable 1 cm left lower pole stone and no obvious ureteral stones.  Urinalysis today is concerning for possible infection with 11-30 WBCs, 3-10 RBCs, no bacteria, 1+ leukocytes, nitrite negative.  We discussed possible etiologies at length including UTI, musculoskeletal injury, ureteral stone, or obstruction from schwannoma.  There is no clear evidence of ureteral stone on her KUB, her schwannoma has been stable for many years and is unlikely to have increased significantly over the last 6 months causing obstruction.  We discussed the possibility of a ball valving effect from the left lower pole stone, but this is less likely in the lower pole.  Urinalysis is concerning for infection today, and I think that is the most likely etiology of her symptoms.  We discussed return precautions at length.  We reviewed options at length for her nonobstructing left lower pole stone including observation/surveillance, shockwave lithotripsy, or ureteroscopy and laser lithotripsy.  If refractory pain/symptoms despite course of antibiotics, consider repeat cross-sectional imaging  -Bactrim DS twice daily x5 days for suspected acute UTI,  follow-up cultures -RTC with Dr. Erlene Quan in 2 weeks for further discussion of shockwave versus ureteroscopy for her left lower pole stone, as she strongly desires removal of the stone   Billey Co, MD  [facility] [address] [place], [place] [address] [phone]

## 2020-02-22 NOTE — Telephone Encounter (Signed)
Called patient to discuss symptoms, scheduled appointment for evaluation in office today. Patient aware to get KUB prior.

## 2020-02-23 LAB — MICROSCOPIC EXAMINATION: Bacteria, UA: NONE SEEN

## 2020-02-23 LAB — URINALYSIS, COMPLETE
Bilirubin, UA: NEGATIVE
Glucose, UA: NEGATIVE
Ketones, UA: NEGATIVE
Nitrite, UA: NEGATIVE
Protein,UA: NEGATIVE
Specific Gravity, UA: 1.01 (ref 1.005–1.030)
Urobilinogen, Ur: 0.2 mg/dL (ref 0.2–1.0)
pH, UA: 7 (ref 5.0–7.5)

## 2020-02-26 ENCOUNTER — Telehealth: Payer: Self-pay

## 2020-02-26 LAB — CULTURE, URINE COMPREHENSIVE

## 2020-02-26 NOTE — Telephone Encounter (Signed)
-----   Message from Billey Co, MD sent at 02/26/2020  8:40 AM EST ----- Urine cx grew e coli, on appropriate abx, keep follow up with Dr. Erlene Quan as scheduled  Nickolas Madrid, MD 02/26/2020

## 2020-03-13 ENCOUNTER — Ambulatory Visit: Payer: Self-pay | Admitting: Urology

## 2020-03-22 ENCOUNTER — Other Ambulatory Visit (INDEPENDENT_AMBULATORY_CARE_PROVIDER_SITE_OTHER): Payer: Medicare Other | Admitting: Vascular Surgery

## 2020-03-25 ENCOUNTER — Encounter (INDEPENDENT_AMBULATORY_CARE_PROVIDER_SITE_OTHER): Payer: Medicare Other

## 2020-04-20 HISTORY — PX: VEIN SURGERY: SHX48

## 2020-05-22 ENCOUNTER — Other Ambulatory Visit: Payer: Self-pay

## 2020-05-22 ENCOUNTER — Ambulatory Visit
Admission: RE | Admit: 2020-05-22 | Discharge: 2020-05-22 | Disposition: A | Discharge status: Home / Self Care | Payer: Medicare Other | Source: Ambulatory Visit | Attending: Urology | Admitting: Urology

## 2020-05-22 ENCOUNTER — Ambulatory Visit (INDEPENDENT_AMBULATORY_CARE_PROVIDER_SITE_OTHER): Payer: Medicare Other | Admitting: Urology

## 2020-05-22 ENCOUNTER — Encounter: Payer: Self-pay | Admitting: Urology

## 2020-05-22 VITALS — BP 114/72 | HR 84 | Ht 66.5 in | Wt 174.0 lb

## 2020-05-22 DIAGNOSIS — R310 Gross hematuria: Secondary | ICD-10-CM | POA: Insufficient documentation

## 2020-05-22 DIAGNOSIS — N200 Calculus of kidney: Secondary | ICD-10-CM | POA: Diagnosis not present

## 2020-05-22 DIAGNOSIS — N390 Urinary tract infection, site not specified: Secondary | ICD-10-CM

## 2020-05-22 LAB — BLADDER SCAN AMB NON-IMAGING: Scan Result: 3

## 2020-05-22 MED ORDER — SULFAMETHOXAZOLE-TRIMETHOPRIM 800-160 MG PO TABS: 1.0000 | ORAL_TABLET | Freq: Two times a day (BID) | ORAL | 0 refills | Status: DC

## 2020-05-22 NOTE — Progress Notes (Signed)
05/22/2020 5:55 PM   BROCK MOKRY 1951/01/01 [id number]  Referring provider: [name], MD [address] [facility] [place],  [place] [address]  Chief Complaint  Patient presents with  . Urinary Frequency   Urological history: 1. High risk hematuria - non-smoker - CTU 05/2019 mild right hydronephrosis and hydroureter extending down to a 4 by 2 by 2 mm in long axis right UVJ calculus.  Nonobstructive 0.8 cm in long axis left kidney lower pole renal calculus.  Stable volume of the left retroperitoneal mass compatible with known schwannoma.  Moderate-sized type 1 hiatal hernia.  Lumbar spondylosis and degenerative disc disease resulting in at least mild bilateral foraminal impingement at L5-S1.  Aortic Atherosclerosis  - cysto 05/2019 NED - followed by nephrology - UA negative for micro heme  2. Nephrolithiasis -Spontaneous passage of a 4 mm right ureteral stone in 2021 -KUB 8 mm left renal stone that may have migrated to the renal pelvis   HPI: Emily Richard is a 70 y.o. female who presents today for complaints of nausea and urinary urgency. She woke up this morning and had a normal void and then the frequency, urgency and urge incontinence kicked in and she is been running to the restroom ever since. She states her urine did look dark this morning and she has had some nausea.    Patient denies any modifying or aggravating factors.  Patient denies any gross hematuria, dysuria or suprapubic/flank pain.  Patient denies any fevers, chills or vomiting.   She does not think there was anything that set this infection off. She is not sexually active. She does not suffer with constipation or diarrhea. She does not soak in a tub. She has reduced the amount of sodas she consumes, but she states she probably could drink more water.  UA > 30 WBC's and few bacteria.  PVR 3 mL.    KUB 8 mm left renal stone appears to have migrated to the renal pelvis    PMH: Past  Medical History:  Diagnosis Date  . Bursitis   . Family history of breast cancer    mother, aunt  . GERD (gastroesophageal reflux disease)   . Hyperlipidemia   . Kidney stone   . Osteoporosis   . Poor circulation   . Schwannoma   . Shortness of breath     Surgical History: Past Surgical History:  Procedure Laterality Date  . BREAST EXCISIONAL BIOPSY Right 2003   NEG  . COLONOSCOPY  01/2014  . tumor biopsy  09/2010  . VULVA /PERINEUM BIOPSY      Home Medications:  Allergies as of 05/22/2020      Reactions   Penicillins Hives, Itching, Rash   All over body. All over body.      Medication List       Accurate as of May 22, 2020 11:59 PM. If you have any questions, ask your nurse or doctor.        STOP taking these medications   nitrofurantoin (macrocrystal-monohydrate) 100 MG capsule Commonly known as: MACROBID Stopped by: Zara Council, PA-C     TAKE these medications   aspirin 81 MG tablet Take 81 mg by mouth daily.   atorvastatin 40 MG tablet Commonly known as: LIPITOR Take 40 mg by mouth daily.   cetirizine 10 MG tablet Commonly known as: ZYRTEC Take 10 mg by mouth daily.   Cholecalciferol 25 MCG (1000 UT) tablet Take by mouth.   denosumab 60 MG/ML  Soln injection Commonly known as: PROLIA Inject into the skin.   denosumab 120 MG/1.7ML Soln injection Commonly known as: XGEVA Inject into the skin.   halobetasol 0.05 % cream Commonly known as: ULTRAVATE AAA sparingly 3-4 times weekly for maintenance   omeprazole 20 MG capsule Commonly known as: PRILOSEC   One Vite Womens Plus 27-1 MG Tabs Take by mouth.   sulfamethoxazole-trimethoprim 800-160 MG tablet Commonly known as: BACTRIM DS Take 1 tablet by mouth every 12 (twelve) hours. Started by: Zara Council, PA-C   tiZANidine 4 MG tablet Commonly known as: ZANAFLEX Take 4 mg by mouth 3 (three) times daily as needed.       Allergies:  Allergies  Allergen Reactions  .  Penicillins Hives, Itching and Rash    All over body. All over body.    Family History: Family History  Problem Relation Age of Onset  . Other Mother        left mastectomy 1998  . Osteoporosis Mother   . Breast cancer Mother 110  . Uterine cancer Mother 13  . Breast cancer Maternal Aunt 57    Social History:  reports that she has never smoked. She has never used smokeless tobacco. She reports that she does not drink alcohol and does not use drugs.  ROS: Pertinent ROS in HPI  Physical Exam: BP 114/72   Pulse 84   Ht 5' 6.5" (1.689 m)   Wt 174 lb (78.9 kg)   BMI 27.66 kg/m   Constitutional:  Well nourished. Alert and oriented, No acute distress. HEENT: [place] AT, mask in place.  Trachea midline Cardiovascular: No clubbing, cyanosis, or edema. Respiratory: Normal respiratory effort, no increased work of breathing. Neurologic: Grossly intact, no focal deficits, moving all 4 extremities. Psychiatric: Normal mood and affect.  Laboratory Data: Specimen:  Blood  Ref Range & Units 2 mo ago  Glucose 70 - 110 mg/dL 91   Sodium 136 - 145 mmol/L 142   Potassium 3.6 - 5.1 mmol/L 4.4   Chloride 97 - 109 mmol/L 104   Carbon Dioxide (CO2) 22.0 - 32.0 mmol/L 35.6High   Urea Nitrogen (BUN) 7 - 25 mg/dL 14   Creatinine 0.6 - 1.1 mg/dL 0.8   Glomerular Filtration Rate (eGFR), MDRD Estimate >60 mL/min/1.73sq m 72   Calcium 8.7 - 10.3 mg/dL 9.6   AST  8 - 39 U/L 22   ALT  5 - 38 U/L 25   Alk Phos (alkaline Phosphatase) 34 - 104 U/L 81   Albumin 3.5 - 4.8 g/dL 4.2   Bilirubin, Total 0.3 - 1.2 mg/dL 1.0   Protein, Total 6.1 - 7.9 g/dL 7.1   A/G Ratio 1.0 - 5.0 gm/dL 1.4   Resulting Agency  [facility]  Specimen Collected: 03/12/20 9:21 AM Last Resulted: 03/12/20 1:56 PM  Received From: [facility]  Result Received: 04/18/20 12:11 PM   Specimen:  Blood  Ref Range & Units 2 mo ago  WBC (White Blood Cell Count) 4.1 - 10.2 10^3/uL 7.2   RBC (Red Blood  Cell Count) 4.04 - 5.48 10^6/uL 4.44   Hemoglobin 12.0 - 15.0 gm/dL 13.4   Hematocrit 35.0 - 47.0 % 42.6   MCV (Mean Corpuscular Volume) 80.0 - 100.0 fl 95.9   MCH (Mean Corpuscular Hemoglobin) 27.0 - 31.2 pg 30.2   MCHC (Mean Corpuscular Hemoglobin Concentration) 32.0 - 36.0 gm/dL 31.5Low   Platelet Count 150 - 450 10^3/uL 245   RDW-CV (Red Cell Distribution Width)  11.6 - 14.8 % 13.3   MPV (Mean Platelet Volume) 9.4 - 12.4 fl 9.0Low   Neutrophils 1.50 - 7.80 10^3/uL 5.10   Lymphocytes 1.00 - 3.60 10^3/uL 1.60   Mixed Count 0.10 - 0.90 10^3/uL 0.50   Neutrophil % 32.0 - 70.0 % 69.7   Lymphocyte % 10.0 - 50.0 % 22.8   Mixed % 3.0 - 14.4 % 7.5   Resulting Agency  [facility] - [facility]  Specimen Collected: 03/12/20 9:21 AM Last Resulted: 03/12/20 3:38 PM  Received From: [facility]  Result Received: 04/18/20 12:11 PM   Urinalysis Component     Latest Ref Rng & Units 05/22/2020  Specific Gravity, UA     1.005 - 1.030 <1.005 (L)  pH, UA     5.0 - 7.5 6.0  Color, UA     Yellow Yellow  Appearance Ur     Clear Cloudy (A)  Leukocytes,UA     Negative 2+ (A)  Protein,UA     Negative/Trace Negative  Glucose, UA     Negative Negative  Ketones, UA     Negative Negative  RBC, UA     Negative 3+ (A)  Bilirubin, UA     Negative Negative  Urobilinogen, Ur     0.2 - 1.0 mg/dL 0.2  Nitrite, UA     Negative Negative  Microscopic Examination      See below:   Component     Latest Ref Rng & Units 05/22/2020  WBC, UA     0 - 5 /hpf >30 (A)  RBC     0 - 2 /hpf 0-2  Epithelial Cells (non renal)     0 - 10 /hpf None seen  Bacteria, UA     None seen/Few Few  I have reviewed the labs.   Pertinent Imaging: Results for DAPHNIE, VENTURINI (MRN [id number]) as of 05/27/2020 20:48  Ref. Range 05/22/2020 13:14  Scan Result Unknown 3 ml  CLINICAL DATA:  Flank pain.  EXAM: ABDOMEN - 1 VIEW  COMPARISON:  02/22/2020.  CT 05/24/2019.  FINDINGS: 8 mm left renal  stone noted. Left renal stone appears to be over the left renal pelvis on today's exam. No evidence of ureteral stone. Stable punctate calcifications noted over the pelvis, most likely phleboliths. Gas pattern nonspecific. Stool noted throughout colon. Degenerative changes scoliosis thoracolumbar spine.  IMPRESSION: 8 mm left renal stone. Left renal stone appears to be over the left renal pelvis on today's exam. No evidence of ureteral stone.   Electronically Signed   By: Marcello Moores  Register   On: 05/22/2020 16:20 I have independently reviewed the films.    Assessment & Plan:    1. UTI  I prescribed Septra DS empirically and will adjust if necessary once culture and sensitivities are available I discussed UTI preventative strategies with the patient and suggested she start taking probiotics and cranberry tablets to help prevent UTIs in the future UA with pyuria Urine is sent for culture  2. Nephrolithiasis Left renal stone  Patient is not having symptoms at this time but would like to discuss definitive stone treatment with Dr. Erlene Quan during her upcoming appointment as she may have an episode of renal colic in the future She will contact the office sooner if she should become systematic    Return for Keep follow-up appointment with Dr. Erlene Quan on the 16th  .  These notes generated with voice recognition software. I apologize for typographical errors.  SHANNON MCGOWAN, PA-C  [place], [place] [address] [phone]

## 2020-05-22 NOTE — Telephone Encounter (Signed)
Patient called the office and spoke with her. She states she is having urinary urgency and nausea. She was added on to PA schedule today for further evaluation. Patient will drop off UA prior to starting AZO and then will go for KUB prior. She will come to the office at 1pm for an appointment with Zara [facility], [facility]

## 2020-05-23 LAB — MICROSCOPIC EXAMINATION
Epithelial Cells (non renal): NONE SEEN /hpf (ref 0–10)
WBC, UA: >30 /hpf — AB (ref 0–5)

## 2020-05-23 LAB — URINALYSIS, COMPLETE
Bilirubin, UA: NEGATIVE
Glucose, UA: NEGATIVE
Ketones, UA: NEGATIVE
Nitrite, UA: NEGATIVE
Protein,UA: NEGATIVE
Specific Gravity, UA: <1.005 — ABNORMAL LOW (ref 1.005–1.030)
Urobilinogen, Ur: 0.2 mg/dL (ref 0.2–1.0)
pH, UA: 6 (ref 5.0–7.5)

## 2020-05-26 LAB — CULTURE, URINE COMPREHENSIVE

## 2020-05-29 ENCOUNTER — Telehealth: Payer: Self-pay

## 2020-05-29 NOTE — Telephone Encounter (Signed)
Patient called today stating she was returning your call from yesterday regarding her KUB results. Please advise

## 2020-06-04 ENCOUNTER — Other Ambulatory Visit: Payer: Self-pay | Admitting: Family Medicine

## 2020-06-04 DIAGNOSIS — Z1231 Encounter for screening mammogram for malignant neoplasm of breast: Secondary | ICD-10-CM

## 2020-06-05 ENCOUNTER — Ambulatory Visit (INDEPENDENT_AMBULATORY_CARE_PROVIDER_SITE_OTHER): Payer: Medicare Other | Admitting: Urology

## 2020-06-05 ENCOUNTER — Other Ambulatory Visit: Payer: Self-pay

## 2020-06-05 ENCOUNTER — Encounter: Payer: Self-pay | Admitting: Urology

## 2020-06-05 VITALS — BP 139/75 | HR 74 | Ht 66.5 in | Wt 174.0 lb

## 2020-06-05 DIAGNOSIS — N200 Calculus of kidney: Secondary | ICD-10-CM

## 2020-06-05 NOTE — Progress Notes (Signed)
06/05/2020 4:29 PM   [name] 1950-08-16 [id number]  Referring provider: [name], MD [address] [facility] [place],  [place] [address]  Chief Complaint  Patient presents with  . Nephrolithiasis    HPI: 70 year old female with recent UTI and left renal pelvic stone presents today to discuss stone management.  She presented to [facility] earlier this month with urinary symptoms consistent with UTI.  She was prescribed Bactrim.  Urinalysis was suspicious but urine culture ended up being negative.   Part of this work-up, she had a KUB indicating an 8 mm left renal pelvic stone.  She thinks that she occasionally has twinges on his left side but no severe pain or discomfort.  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  Notably, the same stone was previously appreciated within a calyx, nonobstructing and is now migrated into the renal pelvis.  She is also subsequently passed a ureteral calculus on the right since her last cross-sectional imaging.   PMH: Past Medical History:  Diagnosis Date  . Bursitis   . Family history of breast cancer    mother, aunt  . GERD (gastroesophageal reflux disease)   . Hyperlipidemia   . Kidney stone   . Osteoporosis   . Poor circulation   . Schwannoma   . Shortness of breath     Surgical History: Past Surgical History:  Procedure Laterality Date  . BREAST EXCISIONAL BIOPSY Right 2003   NEG  . COLONOSCOPY  01/2014  . tumor biopsy  09/2010  . VULVA /PERINEUM BIOPSY      Home Medications:  Allergies as of 06/05/2020      Reactions   Penicillins Hives, Itching, Rash   All over body. All over body.      Medication List       Accurate as of June 05, 2020  4:29 PM. If you have any questions, ask your nurse or doctor.        STOP taking these medications   sulfamethoxazole-trimethoprim 800-160 MG tablet Commonly known as: BACTRIM  DS Stopped by: Hollice Espy, MD     TAKE these medications   aspirin 81 MG tablet Take 81 mg by mouth daily.   atorvastatin 40 MG tablet Commonly known as: LIPITOR Take 40 mg by mouth daily.   cetirizine 10 MG tablet Commonly known as: ZYRTEC Take 10 mg by mouth daily.   Cholecalciferol 25 MCG (1000 UT) tablet Take by mouth.   denosumab 60 MG/ML Soln injection Commonly known as: PROLIA Inject into the skin.   denosumab 120 MG/1.7ML Soln injection Commonly known as: XGEVA Inject into the skin.   halobetasol 0.05 % cream Commonly known as: ULTRAVATE AAA sparingly 3-4 times weekly for maintenance   omeprazole 20 MG capsule Commonly known as: PRILOSEC   One Vite Womens Plus 27-1 MG Tabs Take by mouth.   tiZANidine 4 MG tablet Commonly known as: ZANAFLEX Take 4 mg by mouth 3 (three) times daily as needed.       Allergies:  Allergies  Allergen Reactions  . Penicillins Hives, Itching and Rash    All over body. All over body.    Family History: Family History  Problem Relation Age of Onset  . Other Mother        left mastectomy 1998  . Osteoporosis Mother   . Breast cancer Mother 38  . Uterine cancer Mother 79  . Breast cancer Maternal  Aunt 17    Social History:  reports that she has never smoked. She has never used smokeless tobacco. She reports that she does not drink alcohol and does not use drugs.   Physical Exam: BP 139/75   Pulse 74   Ht 5' 6.5" (1.689 m)   Wt 174 lb (78.9 kg)   BMI 27.66 kg/m   Constitutional:  Alert and oriented, No acute distress. HEENT: [place] AT, moist mucus membranes.  Trachea midline, no masses. Cardiovascular: No clubbing, cyanosis, or edema. Respiratory: Normal respiratory effort, no increased work of breathing. GI: Abdomen is soft, nontender, nondistended, no abdominal masses Skin: No rashes, bruises or suspicious lesions. Neurologic: Grossly intact, no focal deficits, moving all 4 extremities. Psychiatric: Normal  mood and affect.  Laboratory Data: Lab Results  Component Value Date   WBC 8.4 10/26/2016   HGB 14.3 10/26/2016   HCT 42.1 10/26/2016   MCV 93.3 10/26/2016   PLT 204 10/26/2016    Lab Results  Component Value Date   CREATININE 0.70 05/24/2019    Urinalysis Results for orders placed or performed in visit on 05/22/20  CULTURE, URINE COMPREHENSIVE   Specimen: Urine   UR  Result Value Ref Range   Urine Culture, Comprehensive Final report    Organism ID, Bacteria Comment   Microscopic Examination   Urine  Result Value Ref Range   WBC, UA >30 (A) 0 - 5 /hpf   RBC 0-2 0 - 2 /hpf   Epithelial Cells (non renal) None seen 0 - 10 /hpf   Bacteria, UA Few None seen/Few  Urinalysis, Complete  Result Value Ref Range   Specific Gravity, UA <1.005 (L) 1.005 - 1.030   pH, UA 6.0 5.0 - 7.5   Color, UA Yellow Yellow   Appearance Ur Cloudy (A) Clear   Leukocytes,UA 2+ (A) Negative   Protein,UA Negative Negative/Trace   Glucose, UA Negative Negative   Ketones, UA Negative Negative   RBC, UA 3+ (A) Negative   Bilirubin, UA Negative Negative   Urobilinogen, Ur 0.2 0.2 - 1.0 mg/dL   Nitrite, UA Negative Negative   Microscopic Examination See below:   BLADDER SCAN AMB NON-IMAGING  Result Value Ref Range   Scan Result 3 ml     Pertinent Imaging: Abdomen 1 view (KUB)  Narrative CLINICAL DATA:  Flank pain.  EXAM: ABDOMEN - 1 VIEW  COMPARISON:  02/22/2020.  CT 05/24/2019.  FINDINGS: 8 mm left renal stone noted. Left renal stone appears to be over the left renal pelvis on today's exam. No evidence of ureteral stone. Stable punctate calcifications noted over the pelvis, most likely phleboliths. Gas pattern nonspecific. Stool noted throughout colon. Degenerative changes scoliosis thoracolumbar spine.  IMPRESSION: 8 mm left renal stone. Left renal stone appears to be over the left renal pelvis on today's exam. No evidence of ureteral stone.   Electronically Signed By: Marcello Moores   Register On: 05/22/2020 16:20  Above KUB was personally reviewed today.  Agree with radiologic interpretation.  This was compared to her previous CT urogram.   Assessment & Plan:    1. Kidney stone on left side Nonobstructing 8 mm left renal pelvic stone -interval migration into the renal pelvis, suspect she may be having ball valving discomfort based on her symptoms  We discussed various treatment options for urolithiasis including observation with or without medical expulsive therapy, shockwave lithotripsy (SWL), ureteroscopy and laser lithotripsy with stent placement, and percutaneous nephrolithotomy.   We discussed that management is based on stone  size, location, density, patient co-morbidities, and patient preference.    Stones <57mm in size have a >80% spontaneous passage rate. Data surrounding the use of tamsulosin for medical expulsive therapy is controversial, but meta analyses suggests it is most efficacious for distal stones between 5-15mm in size. Possible side effects include dizziness/lightheadedness, and retrograde ejaculation.   SWL has a lower stone free rate in a single procedure, but also a lower complication rate compared to ureteroscopy and avoids a stent and associated stent related symptoms. Possible complications include renal hematoma, steinstrasse, and need for additional treatment. We discussed the role of his increased skin to stone distance can lead to decreased efficacy with shockwave lithotripsy.   Ureteroscopy with laser lithotripsy and stent placement has a higher stone free rate than SWL in a single procedure, however increased complication rate including possible infection, ureteral injury, bleeding, and stent related morbidity. Common stent related symptoms include dysuria, urgency/frequency, and flank pain.   After an extensive discussion of the risks and benefits of the above treatment options, the patient would like to proceed with left ureteroscopy, laser  lithotripsy and ureteral stent placement.    She would like to wait a few weeks before having this done as her husband is having some issues and will need to have a biopsy, etc.  We will get her to get it scheduled.  Warning symptoms reviewed, indications for more urgent/ emergent evaluation.  Hollice Espy, MD  [facility] [address] [place], [place] [address] [phone]

## 2020-06-05 NOTE — H&P (View-Only) (Signed)
06/05/2020 4:29 PM   [name] [birth year] [id number]  Referring provider: [name], MD [address] [facility] [place],  [place] [address]  Chief Complaint  Patient presents with  . Nephrolithiasis    HPI: 70 year old female with recent UTI and left renal pelvic stone presents today to discuss stone management.  She presented to [facility] earlier this month with urinary symptoms consistent with UTI.  She was prescribed Bactrim.  Urinalysis was suspicious but urine culture ended up being negative.   Part of this work-up, she had a KUB indicating an 8 mm left renal pelvic stone.  She thinks that she occasionally has twinges on his left side but no severe pain or discomfort.  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  Notably, the same stone was previously appreciated within a calyx, nonobstructing and is now migrated into the renal pelvis.  She is also subsequently passed a ureteral calculus on the right since her last cross-sectional imaging.   PMH: Past Medical History:  Diagnosis Date  . Bursitis   . Family history of breast cancer    mother, aunt  . GERD (gastroesophageal reflux disease)   . Hyperlipidemia   . Kidney stone   . Osteoporosis   . Poor circulation   . Schwannoma   . Shortness of breath     Surgical History: Past Surgical History:  Procedure Laterality Date  . BREAST EXCISIONAL BIOPSY Right 2003   NEG  . COLONOSCOPY  01/2014  . tumor biopsy  09/2010  . VULVA /PERINEUM BIOPSY      Home Medications:  Allergies as of 06/05/2020      Reactions   Penicillins Hives, Itching, Rash   All over body. All over body.      Medication List       Accurate as of June 05, 2020  4:29 PM. If you have any questions, ask your nurse or doctor.        STOP taking these medications   sulfamethoxazole-trimethoprim 800-160 MG tablet Commonly known as: BACTRIM  DS Stopped by: Hollice Espy, MD     TAKE these medications   aspirin 81 MG tablet Take 81 mg by mouth daily.   atorvastatin 40 MG tablet Commonly known as: LIPITOR Take 40 mg by mouth daily.   cetirizine 10 MG tablet Commonly known as: ZYRTEC Take 10 mg by mouth daily.   Cholecalciferol 25 MCG (1000 UT) tablet Take by mouth.   denosumab 60 MG/ML Soln injection Commonly known as: PROLIA Inject into the skin.   denosumab 120 MG/1.7ML Soln injection Commonly known as: XGEVA Inject into the skin.   halobetasol 0.05 % cream Commonly known as: ULTRAVATE AAA sparingly 3-4 times weekly for maintenance   omeprazole 20 MG capsule Commonly known as: PRILOSEC   One Vite Womens Plus 27-1 MG Tabs Take by mouth.   tiZANidine 4 MG tablet Commonly known as: ZANAFLEX Take 4 mg by mouth 3 (three) times daily as needed.       Allergies:  Allergies  Allergen Reactions  . Penicillins Hives, Itching and Rash    All over body. All over body.    Family History: Family History  Problem Relation Age of Onset  . Other Mother        left mastectomy 1998  . Osteoporosis Mother   . Breast cancer Mother 69  . Uterine cancer Mother 81  . Breast cancer Maternal  Aunt 75    Social History:  reports that she has never smoked. She has never used smokeless tobacco. She reports that she does not drink alcohol and does not use drugs.   Physical Exam: BP 139/75   Pulse 74   Ht 5' 6.5" (1.689 m)   Wt 174 lb (78.9 kg)   BMI 27.66 kg/m   Constitutional:  Alert and oriented, No acute distress. HEENT: [place] AT, moist mucus membranes.  Trachea midline, no masses. Cardiovascular: No clubbing, cyanosis, or edema. Respiratory: Normal respiratory effort, no increased work of breathing. GI: Abdomen is soft, nontender, nondistended, no abdominal masses Skin: No rashes, bruises or suspicious lesions. Neurologic: Grossly intact, no focal deficits, moving all 4 extremities. Psychiatric: Normal  mood and affect.  Laboratory Data: Lab Results  Component Value Date   WBC 8.4 10/26/2016   HGB 14.3 10/26/2016   HCT 42.1 10/26/2016   MCV 93.3 10/26/2016   PLT 204 10/26/2016    Lab Results  Component Value Date   CREATININE 0.70 05/24/2019    Urinalysis Results for orders placed or performed in visit on 05/22/20  CULTURE, URINE COMPREHENSIVE   Specimen: Urine   UR  Result Value Ref Range   Urine Culture, Comprehensive Final report    Organism ID, Bacteria Comment   Microscopic Examination   Urine  Result Value Ref Range   WBC, UA >30 (A) 0 - 5 /hpf   RBC 0-2 0 - 2 /hpf   Epithelial Cells (non renal) None seen 0 - 10 /hpf   Bacteria, UA Few None seen/Few  Urinalysis, Complete  Result Value Ref Range   Specific Gravity, UA <1.005 (L) 1.005 - 1.030   pH, UA 6.0 5.0 - 7.5   Color, UA Yellow Yellow   Appearance Ur Cloudy (A) Clear   Leukocytes,UA 2+ (A) Negative   Protein,UA Negative Negative/Trace   Glucose, UA Negative Negative   Ketones, UA Negative Negative   RBC, UA 3+ (A) Negative   Bilirubin, UA Negative Negative   Urobilinogen, Ur 0.2 0.2 - 1.0 mg/dL   Nitrite, UA Negative Negative   Microscopic Examination See below:   BLADDER SCAN AMB NON-IMAGING  Result Value Ref Range   Scan Result 3 ml     Pertinent Imaging: Abdomen 1 view (KUB)  Narrative CLINICAL DATA:  Flank pain.  EXAM: ABDOMEN - 1 VIEW  COMPARISON:  02/22/2020.  CT 05/24/2019.  FINDINGS: 8 mm left renal stone noted. Left renal stone appears to be over the left renal pelvis on today's exam. No evidence of ureteral stone. Stable punctate calcifications noted over the pelvis, most likely phleboliths. Gas pattern nonspecific. Stool noted throughout colon. Degenerative changes scoliosis thoracolumbar spine.  IMPRESSION: 8 mm left renal stone. Left renal stone appears to be over the left renal pelvis on today's exam. No evidence of ureteral stone.   Electronically Signed By: Marcello Moores   Register On: 05/22/2020 16:20  Above KUB was personally reviewed today.  Agree with radiologic interpretation.  This was compared to her previous CT urogram.   Assessment & Plan:    1. Kidney stone on left side Nonobstructing 8 mm left renal pelvic stone -interval migration into the renal pelvis, suspect she may be having ball valving discomfort based on her symptoms  We discussed various treatment options for urolithiasis including observation with or without medical expulsive therapy, shockwave lithotripsy (SWL), ureteroscopy and laser lithotripsy with stent placement, and percutaneous nephrolithotomy.   We discussed that management is based on stone  size, location, density, patient co-morbidities, and patient preference.    Stones <1mm in size have a >80% spontaneous passage rate. Data surrounding the use of tamsulosin for medical expulsive therapy is controversial, but meta analyses suggests it is most efficacious for distal stones between 5-21mm in size. Possible side effects include dizziness/lightheadedness, and retrograde ejaculation.   SWL has a lower stone free rate in a single procedure, but also a lower complication rate compared to ureteroscopy and avoids a stent and associated stent related symptoms. Possible complications include renal hematoma, steinstrasse, and need for additional treatment. We discussed the role of his increased skin to stone distance can lead to decreased efficacy with shockwave lithotripsy.   Ureteroscopy with laser lithotripsy and stent placement has a higher stone free rate than SWL in a single procedure, however increased complication rate including possible infection, ureteral injury, bleeding, and stent related morbidity. Common stent related symptoms include dysuria, urgency/frequency, and flank pain.   After an extensive discussion of the risks and benefits of the above treatment options, the patient would like to proceed with left ureteroscopy, laser  lithotripsy and ureteral stent placement.    She would like to wait a few weeks before having this done as her husband is having some issues and will need to have a biopsy, etc.  We will get her to get it scheduled.  Warning symptoms reviewed, indications for more urgent/ emergent evaluation.  Hollice Espy, MD  [facility] [address], [place], [place] [address] [phone]

## 2020-06-10 ENCOUNTER — Encounter: Payer: Self-pay | Admitting: Urology

## 2020-06-10 ENCOUNTER — Other Ambulatory Visit: Payer: Self-pay | Admitting: Radiology

## 2020-06-10 DIAGNOSIS — N200 Calculus of kidney: Secondary | ICD-10-CM

## 2020-06-11 ENCOUNTER — Encounter: Payer: Self-pay | Admitting: Urology

## 2020-06-11 ENCOUNTER — Other Ambulatory Visit: Payer: Self-pay

## 2020-06-11 DIAGNOSIS — N200 Calculus of kidney: Secondary | ICD-10-CM

## 2020-06-14 ENCOUNTER — Encounter
Admission: RE | Admit: 2020-06-14 | Discharge: 2020-06-14 | Disposition: A | Discharge status: Home / Self Care | Payer: Medicare Other | Source: Ambulatory Visit | Attending: Urology | Admitting: Urology

## 2020-06-14 ENCOUNTER — Other Ambulatory Visit: Payer: Self-pay

## 2020-06-14 HISTORY — DX: Personal history of urinary calculi: Z87.442

## 2020-06-14 HISTORY — DX: Unspecified osteoarthritis, unspecified site: M19.90

## 2020-06-14 HISTORY — DX: Personal history of other diseases of the digestive system: Z87.19

## 2020-06-14 NOTE — Patient Instructions (Addendum)
Your procedure is scheduled on:06-24-20 MONDAY Report to the Registration Desk on the 1st floor of the [facility]-Then proceed to the 2nd floor Surgery Desk in the [facility] To find out your arrival time, please call [phone] between 1PM - 3PM on:06-21-20 FRIDAY  REMEMBER: Instructions that are not followed completely may result in serious medical risk, up to and including death; or upon the discretion of your surgeon and anesthesiologist your surgery may need to be rescheduled.  Do not eat food or drink liquids after midnight the night before surgery.  No gum chewing, lozengers or hard candies  TAKE THESE MEDICATIONS THE MORNING OF SURGERY WITH A SIP OF WATER: -PRILOSEC (OMEPRAZOLE)-take one the night before and one on the morning of surgery - helps to prevent nausea after surgery.  Follow recommendations from Cardiologist, Pulmonologist or PCP regarding stopping Aspirin, Coumadin, Plavix, Eliquis, Pradaxa, or Pletal-81 MG ASPIRIN WAS STOPPED THE WEEK OF 06-03-20  One week prior to surgery: Stop Anti-inflammatories (NSAIDS) such as Advil, Aleve, Ibuprofen, Motrin, Naproxen, Naprosyn and Aspirin based products such as Excedrin, Goodys Powder, BC Powder-OK TO TAKE TYLENOL IF NEEDED  Stop ANY OVER THE COUNTER supplements until after surgery-STOP CRANBERRY NOW-YOU MAY RESUME AFTER SURGERY (YOU MAY STILL CONTINUE YOUR MULTIVITAMIN, VITAMIN D3 AND PROBIOTIC YOGURT)  No Alcohol for 24 hours before or after surgery.  No Smoking including e-cigarettes for 24 hours prior to surgery.  No chewable tobacco products for at least 6 hours prior to surgery.  No nicotine patches on the day of surgery.  Do not use any "recreational" drugs for at least a week prior to your surgery.  Please be advised that the combination of cocaine and anesthesia may have negative outcomes, up to and including death. If you test positive for cocaine, your surgery will be cancelled.  On the morning of surgery brush  your teeth with toothpaste and water, you may rinse your mouth with mouthwash if you wish. Do not swallow any toothpaste or mouthwash.  Do not wear jewelry, make-up, hairpins, clips or nail polish.  Do not wear lotions, powders, or perfumes.   Do not shave body from the neck down 48 hours prior to surgery just in case you cut yourself which could leave a site for infection.  Also, freshly shaved skin may become irritated if using the CHG soap.  Contact lenses, hearing aids and dentures may not be worn into surgery.  Do not bring valuables to the hospital. [facility] is not responsible for any missing/lost belongings or valuables.   Notify your doctor if there is any change in your medical condition (cold, fever, infection).  Wear comfortable clothing (specific to your surgery type) to the hospital.  Plan for stool softeners for home use; pain medications have a tendency to cause constipation. You can also help prevent constipation by eating foods high in fiber such as fruits and vegetables and drinking plenty of fluids as your diet allows.  After surgery, you can help prevent lung complications by doing breathing exercises.  Take deep breaths and cough every 1-2 hours. Your doctor may order a device called an Incentive Spirometer to help you take deep breaths. When coughing or sneezing, hold a pillow firmly against your incision with both hands. This is called "splinting." Doing this helps protect your incision. It also decreases belly discomfort.  If you are being admitted to the hospital overnight, leave your suitcase in the car. After surgery it may be brought to your room.  If you  are being discharged the day of surgery, you will not be allowed to drive home. You will need a responsible adult (18 years or older) to drive you home and stay with you that night.   If you are taking public transportation, you will need to have a responsible adult (18 years or older) with you. Please  confirm with your physician that it is acceptable to use public transportation.   Please call the [facility]. at [phone] if you have any questions about these instructions.  Visitation Policy:  Patients undergoing a surgery or procedure may have one family member or support person with them as long as that person is not COVID-19 positive or experiencing its symptoms.  That person may remain in the waiting area during the procedure.  Inpatient Visitation:    Visiting hours are 7 a.m. to 8 p.m. Patients will be allowed one visitor. The visitor may change daily. The visitor must pass COVID-19 screenings, use hand sanitizer when entering and exiting the patient's room and wear a mask at all times, including in the patient's room. Patients must also wear a mask when staff or their visitor are in the room. Masking is required regardless of vaccination status. Systemwide, no visitors 17 or younger.  Visitation Policy Changes: The following changes are to take effect on Feb. 28 at 7 a.m.  No visitors under the age of 35. Any visitor under the age of 52 must be accompanied by an adult. Adult inpatients: Two visitors will be allowed daily and the visitors may change each day during the patient's stay. (Please note -- no changes at this time for the [facility], [facility] and inpatients, Emergency Departments, Ambulatory Sites, [facility], medical practices and procedural areas.)

## 2020-06-17 ENCOUNTER — Other Ambulatory Visit: Payer: Medicare Other

## 2020-06-17 ENCOUNTER — Other Ambulatory Visit: Payer: Self-pay

## 2020-06-17 ENCOUNTER — Encounter
Admission: RE | Admit: 2020-06-17 | Discharge: 2020-06-17 | Disposition: A | Discharge status: Home / Self Care | Payer: Medicare Other | Source: Ambulatory Visit | Attending: Urology | Admitting: Urology

## 2020-06-17 DIAGNOSIS — Z01818 Encounter for other preprocedural examination: Secondary | ICD-10-CM | POA: Insufficient documentation

## 2020-06-17 DIAGNOSIS — N200 Calculus of kidney: Secondary | ICD-10-CM

## 2020-06-18 LAB — URINALYSIS, COMPLETE
Bilirubin, UA: NEGATIVE
Glucose, UA: NEGATIVE
Ketones, UA: NEGATIVE
Nitrite, UA: NEGATIVE
Specific Gravity, UA: 1.025 (ref 1.005–1.030)
Urobilinogen, Ur: 1 mg/dL (ref 0.2–1.0)
pH, UA: 5.5 (ref 5.0–7.5)

## 2020-06-18 LAB — MICROSCOPIC EXAMINATION

## 2020-06-20 ENCOUNTER — Other Ambulatory Visit: Payer: Self-pay

## 2020-06-20 ENCOUNTER — Other Ambulatory Visit
Admission: RE | Admit: 2020-06-20 | Discharge: 2020-06-20 | Disposition: A | Discharge status: Home / Self Care | Payer: Medicare Other | Source: Ambulatory Visit | Attending: Urology | Admitting: Urology

## 2020-06-20 ENCOUNTER — Ambulatory Visit
Admission: RE | Admit: 2020-06-20 | Discharge: 2020-06-20 | Disposition: A | Discharge status: Home / Self Care | Payer: Medicare Other | Source: Ambulatory Visit | Attending: Family Medicine | Admitting: Family Medicine

## 2020-06-20 DIAGNOSIS — Z20822 Contact with and (suspected) exposure to covid-19: Secondary | ICD-10-CM | POA: Diagnosis not present

## 2020-06-20 DIAGNOSIS — Z01812 Encounter for preprocedural laboratory examination: Secondary | ICD-10-CM | POA: Diagnosis not present

## 2020-06-20 DIAGNOSIS — Z1231 Encounter for screening mammogram for malignant neoplasm of breast: Secondary | ICD-10-CM | POA: Diagnosis not present

## 2020-06-20 LAB — CULTURE, URINE COMPREHENSIVE

## 2020-06-20 LAB — SARS CORONAVIRUS 2 (TAT 6-24 HRS): SARS Coronavirus 2: NEGATIVE

## 2020-06-23 MED ORDER — CHLORHEXIDINE GLUCONATE 0.12 % MT SOLN: 15.0000 mL | Freq: Once | OROMUCOSAL | Status: AC

## 2020-06-23 MED ORDER — LACTATED RINGERS IV SOLN: INTRAVENOUS | Status: DC

## 2020-06-23 MED ORDER — CEFAZOLIN SODIUM-DEXTROSE 2-4 GM/100ML-% IV SOLN
2.0000 g | INTRAVENOUS | Status: AC
  Administered 2020-06-24: 2 g via INTRAVENOUS

## 2020-06-23 MED ORDER — ORAL CARE MOUTH RINSE: 15.0000 mL | Freq: Once | OROMUCOSAL | Status: AC

## 2020-06-24 ENCOUNTER — Ambulatory Visit
Admission: RE | Admit: 2020-06-24 | Discharge: 2020-06-24 | Disposition: A | Discharge status: Home / Self Care | Payer: Medicare Other | Attending: Urology | Admitting: Urology

## 2020-06-24 ENCOUNTER — Telehealth: Payer: Self-pay | Admitting: Urology

## 2020-06-24 ENCOUNTER — Encounter
Admission: RE | Disposition: A | Discharge status: Home / Self Care | Payer: Self-pay | Source: Home / Self Care | Attending: Urology

## 2020-06-24 ENCOUNTER — Encounter: Payer: Self-pay | Admitting: Urology

## 2020-06-24 ENCOUNTER — Other Ambulatory Visit: Payer: Self-pay | Admitting: Urology

## 2020-06-24 ENCOUNTER — Ambulatory Visit: Payer: Medicare Other

## 2020-06-24 ENCOUNTER — Ambulatory Visit: Payer: Medicare Other | Admitting: Anesthesiology

## 2020-06-24 ENCOUNTER — Other Ambulatory Visit: Payer: Self-pay

## 2020-06-24 DIAGNOSIS — Z803 Family history of malignant neoplasm of breast: Secondary | ICD-10-CM | POA: Insufficient documentation

## 2020-06-24 DIAGNOSIS — Z880 Allergy status to penicillin: Secondary | ICD-10-CM | POA: Diagnosis not present

## 2020-06-24 DIAGNOSIS — Z87442 Personal history of urinary calculi: Secondary | ICD-10-CM | POA: Insufficient documentation

## 2020-06-24 DIAGNOSIS — Z8262 Family history of osteoporosis: Secondary | ICD-10-CM | POA: Diagnosis not present

## 2020-06-24 DIAGNOSIS — Z87440 Personal history of urinary (tract) infections: Secondary | ICD-10-CM | POA: Insufficient documentation

## 2020-06-24 DIAGNOSIS — Z86018 Personal history of other benign neoplasm: Secondary | ICD-10-CM | POA: Diagnosis not present

## 2020-06-24 DIAGNOSIS — Z8049 Family history of malignant neoplasm of other genital organs: Secondary | ICD-10-CM | POA: Diagnosis not present

## 2020-06-24 DIAGNOSIS — N200 Calculus of kidney: Secondary | ICD-10-CM | POA: Insufficient documentation

## 2020-06-24 DIAGNOSIS — N201 Calculus of ureter: Secondary | ICD-10-CM | POA: Diagnosis not present

## 2020-06-24 HISTORY — PX: CYSTOSCOPY/URETEROSCOPY/HOLMIUM LASER/STENT PLACEMENT: SHX6546

## 2020-06-24 SURGERY — CYSTOSCOPY/URETEROSCOPY/HOLMIUM LASER/STENT PLACEMENT
Anesthesia: General | Site: Ureter | Laterality: Left

## 2020-06-24 MED ORDER — ONDANSETRON HCL 4 MG/2ML IJ SOLN
4.0000 mg | Freq: Once | INTRAMUSCULAR | Status: AC | PRN
  Administered 2020-06-24: 4 mg via INTRAVENOUS

## 2020-06-24 MED ORDER — IOHEXOL 180 MG/ML  SOLN
INTRAMUSCULAR | Status: DC | PRN
  Administered 2020-06-24: 3 mL

## 2020-06-24 MED ORDER — TAMSULOSIN HCL 0.4 MG PO CAPS: 0.4000 mg | ORAL_CAPSULE | Freq: Every day | ORAL | 0 refills | Status: DC

## 2020-06-24 MED ORDER — CEFAZOLIN SODIUM-DEXTROSE 2-4 GM/100ML-% IV SOLN
INTRAVENOUS | Status: AC
  Filled 2020-06-24: qty 100

## 2020-06-24 MED ORDER — FENTANYL CITRATE (PF) 100 MCG/2ML IJ SOLN
INTRAMUSCULAR | Status: AC
  Filled 2020-06-24: qty 2

## 2020-06-24 MED ORDER — HYDROCODONE-ACETAMINOPHEN 5-325 MG PO TABS: 1.0000 | ORAL_TABLET | Freq: Four times a day (QID) | ORAL | 0 refills | Status: DC | PRN

## 2020-06-24 MED ORDER — DOCUSATE SODIUM 100 MG PO CAPS: 100.0000 mg | ORAL_CAPSULE | Freq: Two times a day (BID) | ORAL | 0 refills | Status: DC

## 2020-06-24 MED ORDER — DEXAMETHASONE SODIUM PHOSPHATE 10 MG/ML IJ SOLN
INTRAMUSCULAR | Status: DC | PRN
  Administered 2020-06-24: 10 mg via INTRAVENOUS

## 2020-06-24 MED ORDER — CHLORHEXIDINE GLUCONATE 0.12 % MT SOLN
OROMUCOSAL | Status: AC
  Administered 2020-06-24: 15 mL via OROMUCOSAL
  Filled 2020-06-24: qty 15

## 2020-06-24 MED ORDER — ROCURONIUM BROMIDE 100 MG/10ML IV SOLN
INTRAVENOUS | Status: DC | PRN
  Administered 2020-06-24: 50 mg via INTRAVENOUS

## 2020-06-24 MED ORDER — FENTANYL CITRATE (PF) 100 MCG/2ML IJ SOLN
INTRAMUSCULAR | Status: DC | PRN
  Administered 2020-06-24: 50 ug via INTRAVENOUS

## 2020-06-24 MED ORDER — ONDANSETRON HCL 4 MG/2ML IJ SOLN
INTRAMUSCULAR | Status: AC
  Filled 2020-06-24: qty 2

## 2020-06-24 MED ORDER — SUGAMMADEX SODIUM 200 MG/2ML IV SOLN
INTRAVENOUS | Status: DC | PRN
  Administered 2020-06-24: 200 mg via INTRAVENOUS

## 2020-06-24 MED ORDER — MIDAZOLAM HCL 2 MG/2ML IJ SOLN
INTRAMUSCULAR | Status: AC
  Filled 2020-06-24: qty 2

## 2020-06-24 MED ORDER — FENTANYL CITRATE (PF) 100 MCG/2ML IJ SOLN
25.0000 ug | INTRAMUSCULAR | Status: DC | PRN
Start: 2020-06-24 — End: 2020-06-24
  Administered 2020-06-24 (×4): 25 ug via INTRAVENOUS

## 2020-06-24 MED ORDER — PROPOFOL 10 MG/ML IV BOLUS
INTRAVENOUS | Status: DC | PRN
  Administered 2020-06-24: 150 mg via INTRAVENOUS

## 2020-06-24 MED ORDER — LIDOCAINE HCL (CARDIAC) PF 100 MG/5ML IV SOSY
PREFILLED_SYRINGE | INTRAVENOUS | Status: DC | PRN
  Administered 2020-06-24: 100 mg via INTRAVENOUS

## 2020-06-24 MED ORDER — PROPOFOL 10 MG/ML IV BOLUS
INTRAVENOUS | Status: AC
  Filled 2020-06-24: qty 40

## 2020-06-24 MED ORDER — MIDAZOLAM HCL 2 MG/2ML IJ SOLN
INTRAMUSCULAR | Status: DC | PRN
  Administered 2020-06-24: 2 mg via INTRAVENOUS

## 2020-06-24 MED ORDER — ONDANSETRON HCL 4 MG/2ML IJ SOLN
INTRAMUSCULAR | Status: DC | PRN
  Administered 2020-06-24: 4 mg via INTRAVENOUS

## 2020-06-24 MED ORDER — OXYBUTYNIN CHLORIDE 5 MG PO TABS
5.0000 mg | ORAL_TABLET | Freq: Three times a day (TID) | ORAL | 0 refills | Status: DC | PRN
Start: 2020-06-24 — End: 2020-07-11

## 2020-06-24 SURGICAL SUPPLY — 30 items
BAG DRAIN CYSTO-URO LG1000N (MISCELLANEOUS) ×2 IMPLANT
BASKET ZERO TIP 1.9FR (BASKET) ×1 IMPLANT
BRUSH SCRUB EZ 1% IODOPHOR (MISCELLANEOUS) ×2 IMPLANT
BSKT STON RTRVL ZERO TP 1.9FR (BASKET) ×1
CATH URET FLEX-TIP 2 LUMEN 10F (CATHETERS) ×1 IMPLANT
CATH URETL 5X70 OPEN END (CATHETERS) ×2 IMPLANT
CNTNR SPEC 2.5X3XGRAD LEK (MISCELLANEOUS)
CONT SPEC 4OZ STER OR WHT (MISCELLANEOUS)
CONT SPEC 4OZ STRL OR WHT (MISCELLANEOUS)
CONTAINER SPEC 2.5X3XGRAD LEK (MISCELLANEOUS) IMPLANT
DRAPE UTILITY 15X26 TOWEL STRL (DRAPES) ×2 IMPLANT
GLOVE SURG ENC MOIS LTX SZ6.5 (GLOVE) ×2 IMPLANT
GOWN STRL REUS W/ TWL LRG LVL3 (GOWN DISPOSABLE) ×2 IMPLANT
GOWN STRL REUS W/TWL LRG LVL3 (GOWN DISPOSABLE) ×4
GUIDEWIRE GREEN .038 145CM (MISCELLANEOUS) ×1 IMPLANT
GUIDEWIRE STR DUAL SENSOR (WIRE) ×2 IMPLANT
INFUSOR MANOMETER BAG 3000ML (MISCELLANEOUS) ×2 IMPLANT
KIT TURNOVER CYSTO (KITS) ×2 IMPLANT
MANIFOLD NEPTUNE II (INSTRUMENTS) ×2 IMPLANT
PACK CYSTO AR (MISCELLANEOUS) ×2 IMPLANT
SET CYSTO W/LG BORE CLAMP LF (SET/KITS/TRAYS/PACK) ×2 IMPLANT
SHEATH URETERAL 12FRX35CM (MISCELLANEOUS) ×1 IMPLANT
SOL .9 NS 3000ML IRR  AL (IV SOLUTION) ×2
SOL .9 NS 3000ML IRR AL (IV SOLUTION) ×1
SOL .9 NS 3000ML IRR UROMATIC (IV SOLUTION) ×1 IMPLANT
STENT URET 6FRX24 CONTOUR (STENTS) ×1 IMPLANT
STENT URET 6FRX26 CONTOUR (STENTS) IMPLANT
SURGILUBE 2OZ TUBE FLIPTOP (MISCELLANEOUS) ×2 IMPLANT
TRACTIP FLEXIVA PULSE ID 200 (Laser) ×2 IMPLANT
WATER STERILE IRR 1000ML POUR (IV SOLUTION) ×2 IMPLANT

## 2020-06-24 NOTE — Anesthesia Postprocedure Evaluation (Signed)
Anesthesia Post Note  Patient: Emily Richard  Procedure(s) Performed: CYSTOSCOPY/URETEROSCOPY/HOLMIUM LASER/STENT PLACEMENT (Left Ureter)  Patient location during evaluation: PACU Anesthesia Type: General Level of consciousness: awake and alert and oriented Pain management: pain level controlled Vital Signs Assessment: post-procedure vital signs reviewed and stable Respiratory status: spontaneous breathing, nonlabored ventilation and respiratory function stable Cardiovascular status: blood pressure returned to baseline and stable Postop Assessment: no signs of nausea or vomiting Anesthetic complications: no   No complications documented.   Last Vitals:  Vitals:   06/24/20 1445 06/24/20 1500  BP: 129/81 138/68  Pulse: 67 72  Resp: 10 17  Temp: 37 C 36.9 C  SpO2: 95% 96%    Last Pain:  Vitals:   06/24/20 1500  TempSrc:   PainSc: 3                  Dory Verdun

## 2020-06-24 NOTE — Interval H&P Note (Signed)
History and Physical Interval Note:  06/24/2020 12:28 PM  Emily Richard  has presented today for surgery, with the diagnosis of left ureteral stone.  The various methods of treatment have been discussed with the patient and family. After consideration of risks, benefits and other options for treatment, the patient has consented to  Procedure(s): CYSTOSCOPY/URETEROSCOPY/HOLMIUM LASER/STENT PLACEMENT (Left) as a surgical intervention.  The patient's history has been reviewed, patient examined, no change in status, stable for surgery.  I have reviewed the patient's chart and labs.  Questions were answered to the patient's satisfaction.    RRR CTAB   Hollice Espy

## 2020-06-24 NOTE — Discharge Instructions (Addendum)
You have a ureteral stent in place.  This is a tube that extends from your kidney to your bladder.  This may cause urinary bleeding, burning with urination, and urinary frequency.  Please call our office or present to the ED if you develop fevers >101 or pain which is not able to be controlled with oral pain medications.  You may be given either Flomax and/ or ditropan to help with bladder spasms and stent pain in addition to pain medications.    You have a stent taped to your left inner thigh.  On Thursday morning, you may untape and remove the stent in entirety.  If you have any issues, please call our office.  We will have you follow-up in a month with renal ultrasound prior.  [facility] [address], [place], [place] [address] [phone]   AMBULATORY SURGERY  DISCHARGE INSTRUCTIONS   1) The drugs that you were given will stay in your system until tomorrow so for the next 24 hours you should not:  A) Drive an automobile B) Make any legal decisions C) Drink any alcoholic beverage   2) You may resume regular meals tomorrow.  Today it is better to start with liquids and gradually work up to solid foods.  You may eat anything you prefer, but it is better to start with liquids, then soup and crackers, and gradually work up to solid foods.   3) Please notify your doctor immediately if you have any unusual bleeding, trouble breathing, redness and pain at the surgery site, drainage, fever, or pain not relieved by medication.    4) Additional Instructions:        Please contact your physician with any problems or Same Day Surgery at [phone], Monday through Friday 6 am to 4 pm, or [place] at [facility] number at [phone].

## 2020-06-24 NOTE — Op Note (Signed)
Date of procedure: 06/24/20  Preoperative diagnosis:  1. Left kidney stone  Postoperative diagnosis:  1. Same as above  Procedure: 1. Left ureteroscopy 2. Left retrograde pyelogram 3. Left laser lithotripsy 4. Basket extraction of stone fragment His line left ureteral stent placement next 5. Interpretation of fluoroscopy less than 30 minutes  Surgeon: Hollice Espy, MD  Anesthesia: General  Complications: None  Intraoperative findings: 8 mm UPJ stone found in midpole calyx, obliterated and everything but tiny debris removed via basket.  Stent on tether.  EBL: Minimal  Specimens: Stone fragment  Drains: 6 x 24 French double-J ureteral stent on left  Indication: Emily Richard is a 70 y.o. patient with 8 mm renal pelvic stone.  After reviewing the management options for treatment, she elected to proceed with the above surgical procedure(s). We have discussed the potential benefits and risks of the procedure, side effects of the proposed treatment, the likelihood of the patient achieving the goals of the procedure, and any potential problems that might occur during the procedure or recuperation. Informed consent has been obtained.  Description of procedure:  The patient was taken to the operating room and general anesthesia was induced.  The patient was placed in the dorsal lithotomy position, prepped and draped in the usual sterile fashion, and preoperative antibiotics were administered. A preoperative time-out was performed.   A 21 French scope was advanced per urethra into the bladder.  Attention was turned to the left ureteral orifice which was cannulated using a 5 [place] open-ended ureteral catheter just within the UO.  A gentle retrograde pyelogram on this side revealed a filling defect within the renal pelvis consistent with a stone was seen on scout imaging.  A sensor wire was then placed up to the level of the kidney.  A dual-lumen access sheath was used to introduce a  second Super Stiff wire a working wire.  The safety wire snapped in place.  A ureteral access sheath, Lacinda Axon 12/14 was advanced under fluoroscopic guidance up to level the proximal ureter which went quite easily.  The inner lumen and wire were removed and a digital flexible ureteroscope was brought in.  The stone was encountered in a mid lower pole calyx presumably pushed to this location at the time of wire placement.  A 242 m laser fiber was then brought in and using dusting settings of 0.2 J and 40 Hz, the stone was fragmented into tiny pieces.  A 1.9 French tipless nitinol basket was used to extract the larger pieces and the remainder was dusted into tiny pieces less than the size of the tip of the laser fiber.  Once no significant residual stone remained in every calyx was directly visualized, the scope was backed out length of the ureter inspecting along the way.  There were no ureteral injuries, edema, or any other fragments in the ureter appreciated.  A 6 x 24 French double-J ureteral stent was advanced over the wire up to the level of the kidney.  The wires partially drawn till full coil was noted within the renal pelvis as well as within the bladder.  The stent string was left attached to the distal coil of the stent which was attached to the patient's left inner thigh using muscle and Tegaderm.  She was then cleaned and dried, repositioned in the supine position, reversed myesthesia, and taken to the PACU in stable condition.  There were no complications in this case.  Plan: She will remove her own stent on Thursday.  We will have her follow-up in 4 weeks with renal ultrasound prior.  Hollice Espy, M.D.

## 2020-06-24 NOTE — Transfer of Care (Signed)
Immediate Anesthesia Transfer of Care Note  Patient: Emily Richard  Procedure(s) Performed: CYSTOSCOPY/URETEROSCOPY/HOLMIUM LASER/STENT PLACEMENT (Left Ureter)  Patient Location: PACU  Anesthesia Type:General  Level of Consciousness: awake  Airway & Oxygen Therapy: Patient Spontanous Breathing  Post-op Assessment: Report given to RN  Post vital signs: stable  Last Vitals:  Vitals Value Taken Time  BP 133/70 06/24/20 1410  Temp    Pulse 75 06/24/20 1412  Resp 19 06/24/20 1412  SpO2 96 % 06/24/20 1412  Vitals shown include unvalidated device data.  Last Pain:  Vitals:   06/24/20 0806  TempSrc: Tympanic  PainSc: 0-No pain         Complications: No complications documented.

## 2020-06-24 NOTE — Telephone Encounter (Signed)
pt states her pain med was sent to [facility] in [place] but that [facility] does not have it in [place], pt wants to know if you would send rx to [facility] - pt did not have phone/fax number or address.  Please advise.

## 2020-06-24 NOTE — Anesthesia Procedure Notes (Signed)
Procedure Name: Intubation Date/Time: 06/24/2020 1:11 PM Performed by: Leander Rams, CRNA Pre-anesthesia Checklist: Patient identified, Emergency Drugs available, Suction available and Patient being monitored Patient Re-evaluated:Patient Re-evaluated prior to induction Oxygen Delivery Method: Circle system utilized Preoxygenation: Pre-oxygenation with 100% oxygen Induction Type: IV induction Ventilation: Mask ventilation without difficulty Laryngoscope Size: McGraph and 3 Grade View: Grade I Tube type: Oral Tube size: 7.0 mm Number of attempts: 1 Airway Equipment and Method: Stylet Placement Confirmation: ETT inserted through vocal cords under direct vision,  positive ETCO2,  CO2 detector and breath sounds checked- equal and bilateral Secured at: 21 cm Tube secured with: Tape Dental Injury: Teeth and Oropharynx as per pre-operative assessment

## 2020-06-24 NOTE — Telephone Encounter (Signed)
done

## 2020-06-24 NOTE — Anesthesia Preprocedure Evaluation (Signed)
Anesthesia Evaluation  Patient identified by MRN, date of birth, ID band Patient awake    Reviewed: Allergy & Precautions, NPO status , Patient's Chart, lab work & pertinent test results  History of Anesthesia Complications Negative for: history of anesthetic complications  Airway Mallampati: II       Dental   Pulmonary neg sleep apnea, neg COPD, Not current smoker,           Cardiovascular (-) hypertension(-) Past MI and (-) CHF (-) dysrhythmias (-) Valvular Problems/Murmurs     Neuro/Psych neg Seizures    GI/Hepatic Neg liver ROS, hiatal hernia, GERD  Medicated,  Endo/Other  neg diabetes  Renal/GU negative Renal ROS     Musculoskeletal   Abdominal   Peds  Hematology   Anesthesia Other Findings   Reproductive/Obstetrics                             Anesthesia Physical Anesthesia Plan  ASA: II  Anesthesia Plan: General   Post-op Pain Management:    Induction: Intravenous  PONV Risk Score and Plan: 3 and Ondansetron and Dexamethasone  Airway Management Planned: Oral ETT  Additional Equipment:   Intra-op Plan:   Post-operative Plan:   Informed Consent: I have reviewed the patients History and Physical, chart, labs and discussed the procedure including the risks, benefits and alternatives for the proposed anesthesia with the patient or authorized representative who has indicated his/her understanding and acceptance.       Plan Discussed with:   Anesthesia Plan Comments:         Anesthesia Quick Evaluation

## 2020-06-25 ENCOUNTER — Encounter: Payer: Self-pay | Admitting: Urology

## 2020-06-26 ENCOUNTER — Other Ambulatory Visit: Payer: Self-pay | Admitting: Radiology

## 2020-06-26 ENCOUNTER — Encounter: Payer: Self-pay | Admitting: Urology

## 2020-06-26 DIAGNOSIS — N201 Calculus of ureter: Secondary | ICD-10-CM

## 2020-06-27 ENCOUNTER — Encounter: Payer: Self-pay | Admitting: Urology

## 2020-06-28 LAB — CALCULI, WITH PHOTOGRAPH (CLINICAL LAB)
Calcium Oxalate Dihydrate: 80 %
Calcium Oxalate Monohydrate: 10 %
Hydroxyapatite: 10 %
Weight Calculi: 12 mg

## 2020-07-11 ENCOUNTER — Other Ambulatory Visit: Payer: Self-pay

## 2020-07-11 ENCOUNTER — Ambulatory Visit (INDEPENDENT_AMBULATORY_CARE_PROVIDER_SITE_OTHER): Payer: Medicare Other | Admitting: Physician Assistant

## 2020-07-11 ENCOUNTER — Encounter: Payer: Self-pay | Admitting: Urology

## 2020-07-11 VITALS — BP 124/78 | HR 75 | Wt 171.0 lb

## 2020-07-11 DIAGNOSIS — R300 Dysuria: Secondary | ICD-10-CM

## 2020-07-11 MED ORDER — SULFAMETHOXAZOLE-TRIMETHOPRIM 800-160 MG PO TABS: 1.0000 | ORAL_TABLET | Freq: Two times a day (BID) | ORAL | 0 refills | Status: AC

## 2020-07-11 NOTE — Progress Notes (Signed)
07/11/2020 4:38 PM   Emily Richard Sep 11, 1950 [id number]  CC: Chief Complaint  Patient presents with  . Dysuria   HPI: Emily Richard is a 70 y.o. female who recently underwent left ureteroscopy with Dr. Erlene Quan for management of an 8 mm left renal stone who presents today for evaluation of possible UTI.  She had a ureteral stent placed intraoperatively and removed at on POD 3 as instructed without difficulty.  Today, patient reports a 1 day history of urinary urgency and dysuria.  She denies flank pain, fever, chills, nausea, or vomiting.  She took Azo this morning for relief of her symptoms with minimal symptomatic improvement.  She is taking cranberry supplements and probiotics for UTI prevention.  In-office UA today positive for orange color, 3+ blood, nitrites, and 1+ leukocyte esterase; urine microscopy with >30 WBCs/HPF and >30 RBCs/HPF.  PMH: Past Medical History:  Diagnosis Date  . Arthritis   . Bursitis   . Family history of breast cancer    mother, aunt  . GERD (gastroesophageal reflux disease)   . History of hiatal hernia   . History of kidney stones   . Hyperlipidemia   . Osteoporosis   . Poor circulation   . Schwannoma   . Shortness of breath     Surgical History: Past Surgical History:  Procedure Laterality Date  . BREAST EXCISIONAL BIOPSY Right 2003   NEG  . COLONOSCOPY  01/2014  . CYSTOSCOPY/URETEROSCOPY/HOLMIUM LASER/STENT PLACEMENT Left 06/24/2020   Procedure: CYSTOSCOPY/URETEROSCOPY/HOLMIUM LASER/STENT PLACEMENT;  Surgeon: Hollice Espy, MD;  Location: [facility] ORS;  Service: Urology;  Laterality: Left;  . tumor biopsy  09/2010  . VEIN SURGERY  04/2020   DONE IN OFFICE  . VULVA /PERINEUM BIOPSY      Home Medications:  Allergies as of 07/11/2020      Reactions   Penicillins Hives, Itching, Rash   All over body. All over body.      Medication List       Accurate as of July 11, 2020  4:38 PM. If you have any questions, ask your nurse or  doctor.        STOP taking these medications   HYDROcodone-acetaminophen 5-325 MG tablet Commonly known as: NORCO/VICODIN Stopped by: Debroah Loop, PA-C   oxybutynin 5 MG tablet Commonly known as: DITROPAN Stopped by: Debroah Loop, PA-C   tamsulosin 0.4 MG Caps capsule Commonly known as: Flomax Stopped by: Debroah Loop, PA-C     TAKE these medications   acetaminophen 500 MG tablet Commonly known as: TYLENOL Take 500-1,000 mg by mouth every 6 (six) hours as needed (pain).   aspirin EC 81 MG tablet Take 81 mg by mouth daily. Swallow whole.   atorvastatin 40 MG tablet Commonly known as: LIPITOR Take 40 mg by mouth every evening.   cetirizine 10 MG tablet Commonly known as: ZYRTEC Take 10 mg by mouth daily as needed for allergies.   Cholecalciferol 25 MCG (1000 UT) tablet Take 1,000 Units by mouth daily.   CRANBERRY PO Take by mouth.   denosumab 60 MG/ML Soln injection Commonly known as: PROLIA Inject 60 mg into the skin every 6 (six) months.   docusate sodium 100 MG capsule Commonly known as: COLACE Take 1 capsule (100 mg total) by mouth 2 (two) times daily.   fexofenadine 180 MG tablet Commonly known as: ALLEGRA Take 180 mg by mouth daily as needed for allergies.   fluticasone 50 MCG/ACT nasal spray Commonly known as: FLONASE Place 1 spray into  both nostrils daily as needed for allergies.   halobetasol 0.05 % cream Commonly known as: ULTRAVATE AAA sparingly 3-4 times weekly for maintenance What changed:   how much to take  how to take this  when to take this  additional instructions   multivitamin with minerals Tabs tablet Take 1 tablet by mouth daily. One A Day Women's 50+   omeprazole 20 MG capsule Commonly known as: PRILOSEC Take 20 mg by mouth every morning.   sulfamethoxazole-trimethoprim 800-160 MG tablet Commonly known as: BACTRIM DS Take 1 tablet by mouth 2 (two) times daily for 5 days. Started by:  Debroah Loop, PA-C       Allergies:  Allergies  Allergen Reactions  . Penicillins Hives, Itching and Rash    All over body. All over body.    Family History: Family History  Problem Relation Age of Onset  . Other Mother        left mastectomy 1998  . Osteoporosis Mother   . Breast cancer Mother 64  . Uterine cancer Mother 56  . Breast cancer Maternal Aunt 57    Social History:   reports that she has never smoked. She has never used smokeless tobacco. She reports that she does not drink alcohol and does not use drugs.  Physical Exam: BP 124/78   Pulse 75   Wt 171 lb (77.6 kg)   BMI 27.19 kg/m   Constitutional:  Alert and oriented, no acute distress, nontoxic appearing HEENT: [place], AT Cardiovascular: No clubbing, cyanosis, or edema Respiratory: Normal respiratory effort, no increased work of breathing Skin: No rashes, bruises or suspicious lesions Neurologic: Grossly intact, no focal deficits, moving all 4 extremities Psychiatric: Normal mood and affect  Laboratory Data: Results for orders placed or performed in visit on 07/11/20  Microscopic Examination   Urine  Result Value Ref Range   WBC, UA >30 (A) 0 - 5 /hpf   RBC >30 (A) 0 - 2 /hpf   Epithelial Cells (non renal) 0-10 0 - 10 /hpf   Bacteria, UA Few None seen/Few  Urinalysis, Complete  Result Value Ref Range   Specific Gravity, UA 1.015 1.005 - 1.030   pH, UA 7.0 5.0 - 7.5   Color, UA Orange Yellow   Appearance Ur Cloudy (A) Clear   Leukocytes,UA 1+ (A) Negative   Protein,UA Negative Negative/Trace   Glucose, UA Negative Negative   Ketones, UA Negative Negative   RBC, UA 3+ (A) Negative   Bilirubin, UA Negative Negative   Urobilinogen, Ur 0.2 0.2 - 1.0 mg/dL   Nitrite, UA Positive (A) Negative   Microscopic Examination See below:    Assessment & Plan:   1. Dysuria UA today notable for microscopic hematuria and pyuria.  Nitrites likely false positive in the setting of recent Azo use.  Will  send for culture for further evaluation and start empiric Bactrim.  We will plan to repeat UA in 1 week to prove resolution of MH.  Patient expressed understanding. - Urinalysis, Complete - CULTURE, URINE COMPREHENSIVE - sulfamethoxazole-trimethoprim (BACTRIM DS) 800-160 MG tablet; Take 1 tablet by mouth 2 (two) times daily for 5 days.  Dispense: 10 tablet; Refill: 0  Return in about 1 week (around 07/18/2020) for Lab visit for UA.  Debroah Loop, PA-C  [facility] [address], [place], [place] [address] [phone]

## 2020-07-12 ENCOUNTER — Ambulatory Visit: Payer: Self-pay | Admitting: Urology

## 2020-07-12 ENCOUNTER — Ambulatory Visit: Payer: Self-pay | Admitting: Physician Assistant

## 2020-07-12 LAB — MICROSCOPIC EXAMINATION
RBC, Urine: >30 /hpf — AB (ref 0–2)
WBC, UA: >30 /hpf — AB (ref 0–5)

## 2020-07-12 LAB — URINALYSIS, COMPLETE
Bilirubin, UA: NEGATIVE
Glucose, UA: NEGATIVE
Ketones, UA: NEGATIVE
Nitrite, UA: POSITIVE — AB
Protein,UA: NEGATIVE
Specific Gravity, UA: 1.015 (ref 1.005–1.030)
Urobilinogen, Ur: 0.2 mg/dL (ref 0.2–1.0)
pH, UA: 7 (ref 5.0–7.5)

## 2020-07-16 ENCOUNTER — Other Ambulatory Visit: Payer: Self-pay | Admitting: Urology

## 2020-07-17 ENCOUNTER — Other Ambulatory Visit: Payer: Self-pay | Admitting: Obstetrics and Gynecology

## 2020-07-17 ENCOUNTER — Encounter: Payer: Self-pay | Admitting: Urology

## 2020-07-17 DIAGNOSIS — L900 Lichen sclerosus et atrophicus: Secondary | ICD-10-CM

## 2020-07-18 ENCOUNTER — Other Ambulatory Visit: Payer: Self-pay

## 2020-07-18 LAB — CULTURE, URINE COMPREHENSIVE

## 2020-07-19 ENCOUNTER — Other Ambulatory Visit: Payer: Medicare Other

## 2020-07-19 ENCOUNTER — Other Ambulatory Visit: Payer: Self-pay

## 2020-07-19 DIAGNOSIS — R310 Gross hematuria: Secondary | ICD-10-CM

## 2020-07-19 LAB — URINALYSIS, COMPLETE
Bilirubin, UA: NEGATIVE
Glucose, UA: NEGATIVE
Ketones, UA: NEGATIVE
Nitrite, UA: NEGATIVE
Protein,UA: NEGATIVE
RBC, UA: NEGATIVE
Specific Gravity, UA: 1.02 (ref 1.005–1.030)
Urobilinogen, Ur: 0.2 mg/dL (ref 0.2–1.0)
pH, UA: 6 (ref 5.0–7.5)

## 2020-07-19 LAB — MICROSCOPIC EXAMINATION: Bacteria, UA: NONE SEEN

## 2020-07-22 ENCOUNTER — Telehealth: Payer: Self-pay

## 2020-07-22 NOTE — Telephone Encounter (Signed)
Pt calling; went to p/u refill of halobetasol; was told to contact provider.  [phone]

## 2020-07-22 NOTE — Telephone Encounter (Signed)
Pt with hx of LS and needs yearly f/u for it (Medicare annual every 2 yrs, LS f/u yearly). Pt to sched appt and can then RF Rx clobetasol. Thx.

## 2020-07-23 ENCOUNTER — Other Ambulatory Visit: Payer: Self-pay

## 2020-07-23 ENCOUNTER — Ambulatory Visit (INDEPENDENT_AMBULATORY_CARE_PROVIDER_SITE_OTHER): Payer: Medicare Other | Admitting: Obstetrics and Gynecology

## 2020-07-23 ENCOUNTER — Other Ambulatory Visit: Payer: Self-pay | Admitting: Obstetrics and Gynecology

## 2020-07-23 ENCOUNTER — Encounter: Payer: Self-pay | Admitting: Obstetrics and Gynecology

## 2020-07-23 VITALS — BP 106/70 | Ht 66.0 in | Wt 171.0 lb

## 2020-07-23 DIAGNOSIS — L900 Lichen sclerosus et atrophicus: Secondary | ICD-10-CM | POA: Diagnosis not present

## 2020-07-23 MED ORDER — CLOBETASOL PROPIONATE 0.05 % EX OINT: TOPICAL_OINTMENT | CUTANEOUS | 1 refills | Status: DC

## 2020-07-23 NOTE — Patient Instructions (Signed)
I value your feedback and you entrusting us with your care. If you get a [facility] patient survey, I would appreciate you taking the time to let us know about your experience today. Thank you! ? ? ?

## 2020-07-23 NOTE — Telephone Encounter (Signed)
Patient is scheduled 07/23/20

## 2020-07-23 NOTE — Telephone Encounter (Signed)
Please see

## 2020-07-23 NOTE — Telephone Encounter (Signed)
Pt aware. Pls call pt to schedule appt.

## 2020-07-23 NOTE — Progress Notes (Signed)
[name], MD   Chief Complaint  Patient presents with  . Follow-up    LS    HPI:      [name] is a 70 y.o. G2P2002 whose LMP was No LMP recorded. Patient is postmenopausal., presents today for yearly LS f/u. Pt treating with halobetasol (due to insurance coverage) 3 times wkly with sx relief. Denies any vag sx. Needs Rx RF. No PMB. Medicare annual done last yr, due again next yr.   Sex activity:not sexually active. Shedoeshave vaginal dryness. [name]   Past Medical History:  Diagnosis Date  . Arthritis   . Bursitis   . Family history of breast cancer    mother, aunt  . GERD (gastroesophageal reflux disease)   . History of hiatal hernia   . History of kidney stones   . Hyperlipidemia   . Osteoporosis   . Poor circulation   . Schwannoma   . Shortness of breath     Past Surgical History:  Procedure Laterality Date  . BREAST EXCISIONAL BIOPSY Right 2003   NEG  . COLONOSCOPY  01/2014  . CYSTOSCOPY/URETEROSCOPY/HOLMIUM LASER/STENT PLACEMENT Left 06/24/2020   Procedure: CYSTOSCOPY/URETEROSCOPY/HOLMIUM LASER/STENT PLACEMENT;  Surgeon: Hollice Espy, MD;  Location: [facility] ORS;  Service: Urology;  Laterality: Left;  . tumor biopsy  09/2010  . VEIN SURGERY  04/2020   DONE IN OFFICE  . VULVA /PERINEUM BIOPSY      Family History  Problem Relation Age of Onset  . Other Mother        left mastectomy 1998  . Osteoporosis Mother   . Breast cancer Mother 54  . Uterine cancer Mother 81  . Breast cancer Maternal Aunt 57    Social History   Socioeconomic History  . Marital status: Married    Spouse name: Not on file  . Number of children: Not on file  . Years of education: Not on file  . Highest education level: Not on file  Occupational History  . Not on file  Tobacco Use  . Smoking status: Never  Smoker  . Smokeless tobacco: Never Used  Vaping Use  . Vaping Use: Never used  Substance and Sexual Activity  . Alcohol use: No  . Drug use: No  . Sexual activity: Not Currently    Birth control/protection: Post-menopausal  Other Topics Concern  . Not on file  Social History Narrative  . Not on file   Social Determinants of Health   Financial Resource Strain: Not on file  Food Insecurity: Not on file  Transportation Needs: Not on file  Physical Activity: Not on file  Stress: Not on file  Social Connections: Not on file  Intimate Partner Violence: Not on file    Outpatient Medications Prior to Visit  Medication Sig Dispense Refill  . acetaminophen (TYLENOL) 500 MG tablet Take 500-1,000 mg by mouth every 6 (six) hours as needed (pain).    Marland Kitchen aspirin EC 81 MG tablet Take 81 mg by mouth daily. Swallow whole.    Marland Kitchen atorvastatin (LIPITOR) 40 MG tablet Take 40 mg by mouth every evening.  11  . cetirizine (ZYRTEC) 10 MG tablet Take 10 mg by mouth daily as needed for allergies.    . Cholecalciferol 25 MCG (1000 UT) tablet Take 1,000 Units by mouth daily.    Marland Kitchen CRANBERRY PO Take  by mouth.    . denosumab (PROLIA) 60 MG/ML SOLN injection Inject 60 mg into the skin every 6 (six) months.    . docusate sodium (COLACE) 100 MG capsule Take 1 capsule (100 mg total) by mouth 2 (two) times daily. 60 capsule 0  . fexofenadine (ALLEGRA) 180 MG tablet Take 180 mg by mouth daily as needed for allergies.    . fluticasone (FLONASE) 50 MCG/ACT nasal spray Place 1 spray into both nostrils daily as needed for allergies.    . Multiple Vitamin (MULTIVITAMIN WITH MINERALS) TABS tablet Take 1 tablet by mouth daily. One A Day Women's 50+    . halobetasol (ULTRAVATE) 0.05 % cream AAA sparingly 3-4 times weekly for maintenance (Patient taking differently: Apply 1 application topically 3 (three) times a week. Applied to vaginal areas for lichen sclerosus) 50 g 1  . omeprazole (PRILOSEC) 20 MG capsule Take 20 mg by  mouth every morning.     Facility-Administered Medications Prior to Visit  Medication Dose Route Frequency Provider Last Rate Last Admin  . triamcinolone acetonide (KENALOG) 10 MG/ML injection 10 mg  10 mg Other Once Landis Martins, DPM          ROS:  Review of Systems  Constitutional: Negative for fever.  Gastrointestinal: Negative for blood in stool, constipation, diarrhea, nausea and vomiting.  Genitourinary: Negative for dyspareunia, dysuria, flank pain, frequency, hematuria, urgency, vaginal bleeding, vaginal discharge and vaginal pain.  Musculoskeletal: Negative for back pain.  Skin: Negative for rash.    OBJECTIVE:   Vitals:  BP 106/70   Ht 5\' 6"  (1.676 m)   Wt 171 lb (77.6 kg)   BMI 27.60 kg/m   Physical Exam Vitals reviewed.  Constitutional:      Appearance: Normal appearance.  Pulmonary:     Effort: Pulmonary effort is normal.  Genitourinary:    Labia:        Right: No rash, tenderness or lesion.        Left: No rash, tenderness or lesion.      Comments: NO EVIDENCE OF LS; VAGINAL TISSUE WNL Neurological:     Mental Status: She is alert and oriented to person, place, and time.  Psychiatric:        Judgment: Judgment normal.     Assessment/Plan: Lichen sclerosus - neg vag exam. Can decrease halobetasol to 2 times wkly. Increase to 3 times wkly again if vag itching occurs. Doing well. F/u in 1 yr at annual/sooner prn. Rx RF halobetasol oint.     Return in about 1 year (around 10/25/2421) for annual.  Allizon Woznick B. Shamiya Demeritt, PA-C 07/23/2020 3:36 PM

## 2020-07-31 ENCOUNTER — Telehealth: Payer: Self-pay

## 2020-07-31 ENCOUNTER — Encounter: Payer: Self-pay | Admitting: Urology

## 2020-07-31 NOTE — Progress Notes (Signed)
08/01/2020 9:11 AM   Emily Richard [birth year] [id number]  Referring provider: [name], MD [address] [facility] [place],  [place] [address]  Chief Complaint  Patient presents with  . Dysuria   Urological history: 1. High risk hematuria -non-smoker -CTU 2021 Mild right hydronephrosis and hydroureter extending down to a 4 by 2 by 2 mm in long axis right UVJ calculus.  Nonobstructive 0.8 cm in long axis left kidney lower pole renal calculus -cystoscopy 2021 NED -UA negative for micro heme  2. Nephrolithiasis -most recent episode 06/2020 for left URS -stone composition 80% calcium oxalate dihydrate, 10% calcium oxalate monohydrate and 10% and 10% hydroxyapatite  3. rUTI's -contributing factors of age and vaginal atrophy -documented positive urine cultures over the last year  Pan sensitive E.coli on 02/22/2020  Streptococcus anginosis group on 08/18/2019  HPI: Emily Richard is a 70 y.o. female who presents today for an urgent appointment for dysuria and gross hematuria.    She states that yesterday she started doing her morning stretches and then felt the urge to void which continued to persist throughout the day.  She also had blood on her toilet tissue when she wiped.  This morning the bladder seem to abated and the urge to void has become mild.  Patient denies any modifying or aggravating factors.  Patient denies any flank pain.  Patient denies any fevers, chills, nausea or vomiting.   UA negative.    PMH: Past Medical History:  Diagnosis Date  . Arthritis   . Bursitis   . Family history of breast cancer    mother, aunt  . GERD (gastroesophageal reflux disease)   . History of hiatal hernia   . History of kidney stones   . Hyperlipidemia   . Osteoporosis   . Poor circulation   . Schwannoma   . Shortness of breath     Surgical History: Past Surgical History:  Procedure Laterality Date  . BREAST EXCISIONAL BIOPSY Right 2003   NEG  .  COLONOSCOPY  01/2014  . CYSTOSCOPY/URETEROSCOPY/HOLMIUM LASER/STENT PLACEMENT Left 06/24/2020   Procedure: CYSTOSCOPY/URETEROSCOPY/HOLMIUM LASER/STENT PLACEMENT;  Surgeon: Hollice Espy, MD;  Location: [facility] ORS;  Service: Urology;  Laterality: Left;  . tumor biopsy  09/2010  . VEIN SURGERY  04/2020   DONE IN OFFICE  . VULVA /PERINEUM BIOPSY      Home Medications:  Allergies as of 08/01/2020      Reactions   Penicillins Hives, Itching, Rash   All over body. All over body.      Medication List       Accurate as of August 01, 2020 11:59 PM. If you have any questions, ask your nurse or doctor.        STOP taking these medications   docusate sodium 100 MG capsule Commonly known as: COLACE Stopped by: Delsy Etzkorn, PA-C   fexofenadine 180 MG tablet Commonly known as: ALLEGRA Stopped by: Zara Council, PA-C     TAKE these medications   acetaminophen 500 MG tablet Commonly known as: TYLENOL Take 500-1,000 mg by mouth every 6 (six) hours as needed (pain).   aspirin EC 81 MG tablet Take 81 mg by mouth daily. Swallow whole.   atorvastatin 40 MG tablet Commonly known as: LIPITOR Take 40 mg by mouth every evening.   cetirizine 10 MG tablet Commonly known as: ZYRTEC Take 10 mg by mouth daily as needed for allergies.   Cholecalciferol 25 MCG (1000 UT) tablet Take 1,000 Units  by mouth daily.   CRANBERRY PO Take by mouth.   denosumab 60 MG/ML Soln injection Commonly known as: PROLIA Inject 60 mg into the skin every 6 (six) months.   fluticasone 50 MCG/ACT nasal spray Commonly known as: FLONASE Place 1 spray into both nostrils daily as needed for allergies.   halobetasol 0.05 % ointment Commonly known as: ULTRAVATE AAA 2-3 times weekly as maintenance   multivitamin with minerals Tabs tablet Take 1 tablet by mouth daily. One A Day Women's 50+   omeprazole 20 MG capsule Commonly known as: PRILOSEC Take 20 mg by mouth every morning.       Allergies:   Allergies  Allergen Reactions  . Penicillins Hives, Itching and Rash    All over body. All over body.    Family History: Family History  Problem Relation Age of Onset  . Other Mother        left mastectomy 1998  . Osteoporosis Mother   . Breast cancer Mother 86  . Uterine cancer Mother 38  . Breast cancer Maternal Aunt 57    Social History:  reports that she has never smoked. She has never used smokeless tobacco. She reports that she does not drink alcohol and does not use drugs.  ROS: Pertinent ROS in HPI  Physical Exam: BP 123/71   Pulse 76   Ht 5\' 6"  (1.676 m)   Wt 171 lb (77.6 kg)   BMI 27.60 kg/m   Constitutional:  Well nourished. Alert and oriented, No acute distress. HEENT: [place] AT, mask in place.  Trachea midline Cardiovascular: No clubbing, cyanosis, or edema. Respiratory: Normal respiratory effort, no increased work of breathing. Neurologic: Grossly intact, no focal deficits, moving all 4 extremities. Psychiatric: Normal mood and affect.  Laboratory Data: Urinalysis Component     Latest Ref Rng & Units 08/01/2020  Specific Gravity, UA     1.005 - 1.030 <1.005 (L)  pH, UA     5.0 - 7.5 5.5  Color, UA     Yellow Yellow  Appearance Ur     Clear Clear  Leukocytes,UA     Negative Negative  Protein,UA     Negative/Trace Negative  Glucose, UA     Negative Negative  Ketones, UA     Negative Negative  RBC, UA     Negative Negative  Bilirubin, UA     Negative Negative  Urobilinogen, Ur     0.2 - 1.0 mg/dL 0.2  Nitrite, UA     Negative Negative  Microscopic Examination      See below:   Component     Latest Ref Rng & Units 08/01/2020  WBC, UA     0 - 5 /hpf 0-5  RBC     0 - 2 /hpf None seen  Epithelial Cells (non renal)     0 - 10 /hpf 0-10  Bacteria, UA     None seen/Few None seen    I have reviewed the labs.   Pertinent Imaging: RUS pending  Assessment & Plan:    1. Gross hematuria -resolved -Discussed with the patient that this  may have been the result of some debris passing through the ureter that was released with her morning stretches and for her to continue to monitor -Her ultrasound scheduled for April 18  2. Dysuria -Seems to be improving -We will send urine to rule out any indolent infections -Encourage patient to continue to push fluids to clear the ureter  Return for RUS on 04/18.  These notes generated with voice recognition software. I apologize for typographical errors.  Zara Council, PA-C  [facility] [address]  [place], [place] [address] [phone]

## 2020-07-31 NOTE — Telephone Encounter (Signed)
Contacted patient in response to [employer]. She states she has started having dysuria again and blood in her urine. Patient denies flank pain, fever, chills, nausea or vomiting. She was added on to PA schedule tomorrow for further evaluation.

## 2020-08-01 ENCOUNTER — Other Ambulatory Visit: Payer: Self-pay

## 2020-08-01 ENCOUNTER — Ambulatory Visit (INDEPENDENT_AMBULATORY_CARE_PROVIDER_SITE_OTHER): Payer: Medicare Other | Admitting: Urology

## 2020-08-01 ENCOUNTER — Encounter: Payer: Self-pay | Admitting: Urology

## 2020-08-01 VITALS — BP 123/71 | HR 76 | Ht 66.0 in | Wt 171.0 lb

## 2020-08-01 DIAGNOSIS — R310 Gross hematuria: Secondary | ICD-10-CM | POA: Diagnosis not present

## 2020-08-01 DIAGNOSIS — R300 Dysuria: Secondary | ICD-10-CM

## 2020-08-05 ENCOUNTER — Ambulatory Visit
Admission: RE | Admit: 2020-08-05 | Discharge: 2020-08-05 | Disposition: A | Discharge status: Home / Self Care | Payer: Medicare Other | Source: Ambulatory Visit | Attending: Urology | Admitting: Urology

## 2020-08-05 ENCOUNTER — Other Ambulatory Visit: Payer: Self-pay

## 2020-08-05 DIAGNOSIS — N201 Calculus of ureter: Secondary | ICD-10-CM | POA: Diagnosis present

## 2020-08-05 LAB — URINALYSIS, COMPLETE
Bilirubin, UA: NEGATIVE
Glucose, UA: NEGATIVE
Ketones, UA: NEGATIVE
Leukocytes,UA: NEGATIVE
Nitrite, UA: NEGATIVE
Protein,UA: NEGATIVE
RBC, UA: NEGATIVE
Specific Gravity, UA: <1.005 — ABNORMAL LOW (ref 1.005–1.030)
Urobilinogen, Ur: 0.2 mg/dL (ref 0.2–1.0)
pH, UA: 5.5 (ref 5.0–7.5)

## 2020-08-05 LAB — MICROSCOPIC EXAMINATION
Bacteria, UA: NONE SEEN
RBC, Urine: NONE SEEN /hpf (ref 0–2)

## 2020-08-06 LAB — CULTURE, URINE COMPREHENSIVE

## 2020-08-08 ENCOUNTER — Ambulatory Visit (INDEPENDENT_AMBULATORY_CARE_PROVIDER_SITE_OTHER): Payer: Medicare Other | Admitting: Urology

## 2020-08-08 ENCOUNTER — Other Ambulatory Visit: Payer: Self-pay

## 2020-08-08 ENCOUNTER — Encounter: Payer: Self-pay | Admitting: Urology

## 2020-08-08 VITALS — BP 137/66 | HR 71 | Ht 66.0 in | Wt 173.0 lb

## 2020-08-08 DIAGNOSIS — N200 Calculus of kidney: Secondary | ICD-10-CM

## 2020-08-08 DIAGNOSIS — N390 Urinary tract infection, site not specified: Secondary | ICD-10-CM | POA: Diagnosis not present

## 2020-08-08 NOTE — Progress Notes (Signed)
08/08/2020 10:21 AM   [name] 01-30-1951 [id number]  Referring provider: [name], MD [address] [facility] [place],  [place] [address]  Chief Complaint  Patient presents with  . Nephrolithiasis    4wk post w/RUS results    HPI: 70 year old female who presents today for follow-up following ureteroscopy on 06/24/2020 for left UPJ stone.  She has personal history of gross hematuria, recurrent UTIs and more recently an 8 mm left UPJ stone.  She was taken to the operating room on 06/24/2020 for ureteroscopy which was uncomplicated.  Postoperatively, she did have episodes of gross hematuria and intermittent dysuria ultimately which resolved.  She returns today with a follow-up renal ultrasound.  This shows no evidence of hydronephrosis or visual stone burden.  Kidneys are unremarkable.  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  Stone composition consistent with 80% calcium oxalate dihydrate, 10% calcium oxalate monohydrate and 10% hydro-Apatate.  She denies any further dysuria or gross hematuria.  Her last stone episode was 8 years ago.  She does report that she has made some significant lifestyle changes and has lost 12 pounds.  She does not drink enough water.   PMH: Past Medical History:  Diagnosis Date  . Arthritis   . Bursitis   . Family history of breast cancer    mother, aunt  . GERD (gastroesophageal reflux disease)   . History of hiatal hernia   . History of kidney stones   . Hyperlipidemia   . Osteoporosis   . Poor circulation   . Schwannoma   . Shortness of breath     Surgical History: Past Surgical History:  Procedure Laterality Date  . BREAST EXCISIONAL BIOPSY Right 2003   NEG  . COLONOSCOPY  01/2014  . CYSTOSCOPY/URETEROSCOPY/HOLMIUM LASER/STENT PLACEMENT Left 06/24/2020   Procedure: CYSTOSCOPY/URETEROSCOPY/HOLMIUM LASER/STENT PLACEMENT;  Surgeon: Hollice Espy, MD;  Location: [facility] ORS;  Service: Urology;   Laterality: Left;  . tumor biopsy  09/2010  . VEIN SURGERY  04/2020   DONE IN OFFICE  . VULVA /PERINEUM BIOPSY      Home Medications:  Allergies as of 08/08/2020      Reactions   Penicillins Hives, Itching, Rash   All over body. All over body.      Medication List       Accurate as of August 08, 2020 10:21 AM. If you have any questions, ask your nurse or doctor.        acetaminophen 500 MG tablet Commonly known as: TYLENOL Take 500-1,000 mg by mouth every 6 (six) hours as needed (pain).   aspirin EC 81 MG tablet Take 81 mg by mouth daily. Swallow whole.   atorvastatin 40 MG tablet Commonly known as: LIPITOR Take 40 mg by mouth every evening.   cetirizine 10 MG tablet Commonly known as: ZYRTEC Take 10 mg by mouth daily as needed for allergies.   Cholecalciferol 25 MCG (1000 UT) tablet Take 1,000 Units by mouth daily.   CRANBERRY PO Take by mouth.   denosumab 60 MG/ML Soln injection Commonly known as: PROLIA Inject 60 mg into the skin every 6 (six) months.   fluticasone 50 MCG/ACT nasal spray Commonly known as: FLONASE Place 1 spray into both nostrils daily as needed for allergies.   halobetasol 0.05 % ointment Commonly known as: ULTRAVATE AAA 2-3 times weekly as maintenance   multivitamin with minerals Tabs tablet Take 1 tablet by mouth daily. One A Day Women's 50+   omeprazole  20 MG capsule Commonly known as: PRILOSEC Take 20 mg by mouth every morning.       Allergies:  Allergies  Allergen Reactions  . Penicillins Hives, Itching and Rash    All over body. All over body.    Family History: Family History  Problem Relation Age of Onset  . Other Mother        left mastectomy 1998  . Osteoporosis Mother   . Breast cancer Mother 77  . Uterine cancer Mother 53  . Breast cancer Maternal Aunt 57    Social History:  reports that she has never smoked. She has never used smokeless tobacco. She reports that she does not drink alcohol and does not  use drugs.   Physical Exam: BP 137/66   Pulse 71   Ht 5\' 6"  (1.676 m)   Wt 173 lb (78.5 kg)   BMI 27.92 kg/m   Constitutional:  Alert and oriented, No acute distress. HEENT: [place] AT, moist mucus membranes.  Trachea midline, no masses. Cardiovascular: No clubbing, cyanosis, or edema. Respiratory: Normal respiratory effort, no increased work of breathing. Skin: No rashes, bruises or suspicious lesions. Neurologic: Grossly intact, no focal deficits, moving all 4 extremities. Psychiatric: Normal mood and affect.  Laboratory Data: Lab Results  Component Value Date   WBC 8.4 10/26/2016   HGB 14.3 10/26/2016   HCT 42.1 10/26/2016   MCV 93.3 10/26/2016   PLT 204 10/26/2016    Lab Results  Component Value Date   CREATININE 0.70 05/24/2019  Ultrasound renal complete  Narrative CLINICAL DATA:  Left ureteral stone  EXAM: RENAL / URINARY TRACT ULTRASOUND COMPLETE  COMPARISON:  06/02/2019  FINDINGS: Right Kidney:  Renal measurements: 12 x 5 by nearly 6 cm = volume: 170 to 180 mL. Echogenicity within normal limits. No mass or hydronephrosis visualized.  Left Kidney:  Renal measurements: 13 x 4 x 5 cm = volume: 150 mL. Echogenicity within normal limits. No mass or hydronephrosis visualized.  Bladder:  Appears normal for degree of bladder distention.  Other:  Hypoechoic mass located between the kidney and aorta in the left retroperitoneum, known from prior abdominal CTs and characterized as schwannoma. Mass was measured at 7 x 4.5 x 4 cm which is similar to CT.  IMPRESSION: No hydronephrosis.  No visualized renal calculi by ultrasound.   Electronically Signed By: Monte Fantasia M.D. On: 08/06/2020 08:17  Renal ultrasound personally reviewed today.  Agree with radiologic interpretation.  Assessment & Plan:    1. Nephrolithiasis Status post successful ureteroscopy  Follow-up renal ultrasound is unremarkable, no silent hydronephrosis or residual stone  burden  We discussed general stone prevention techniques including drinking plenty water with goal of producing 2.5 L urine daily, increased citric acid intake, avoidance of high oxalate containing foods, and decreased salt intake.  Information about dietary recommendations given today.  Follow-up in one 1 year with KUB  - Abdomen 1 view (KUB); Future  2. Recurrent UTI Currently asymptomatic, will see as needed for any signs or symptoms of infection   Return in about 1 year (around 08/08/2021) for 8yr w/KUB. or sooner as needed  Hollice Espy, MD  [facility] [address], [place], [place] [address] [phone]

## 2021-02-02 IMAGING — MG DIGITAL SCREENING BILATERAL MAMMOGRAM WITH TOMO AND CAD
8 series · 8 of 24 positions shown · non-contrast
Comparison: Previous exam(s).

CLINICAL DATA: Screening.

EXAM:
DIGITAL SCREENING BILATERAL MAMMOGRAM WITH TOMO AND CAD

[R MLO synth-2D]
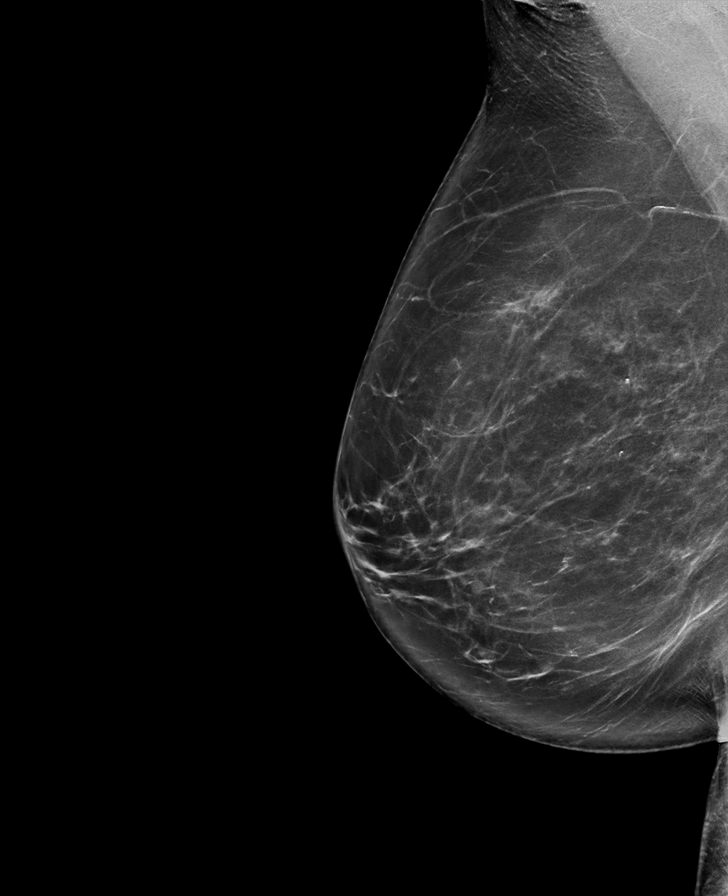

[L CC synth-2D]
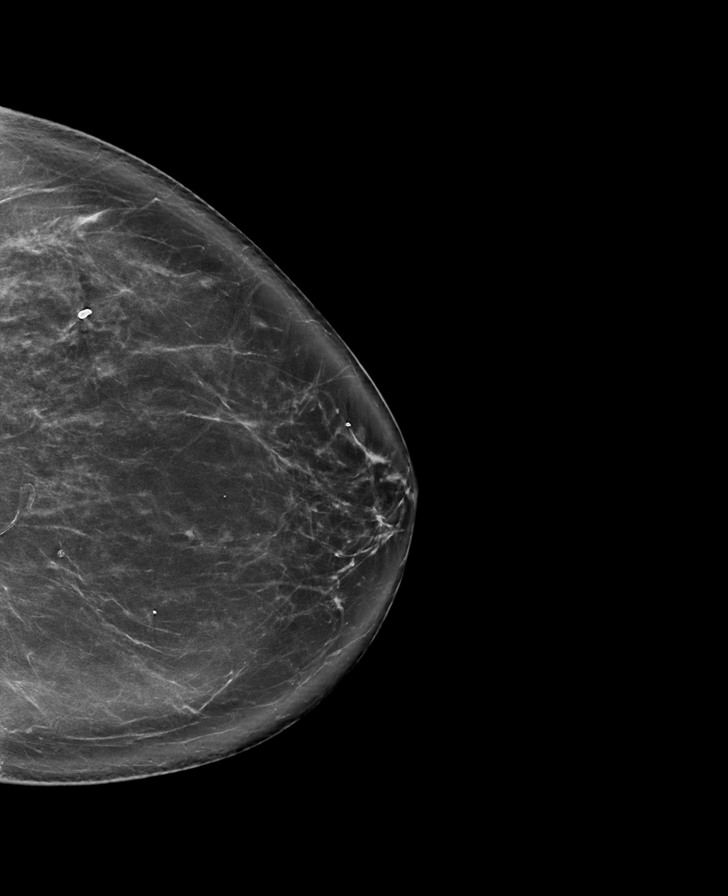

[R CC synth-2D]
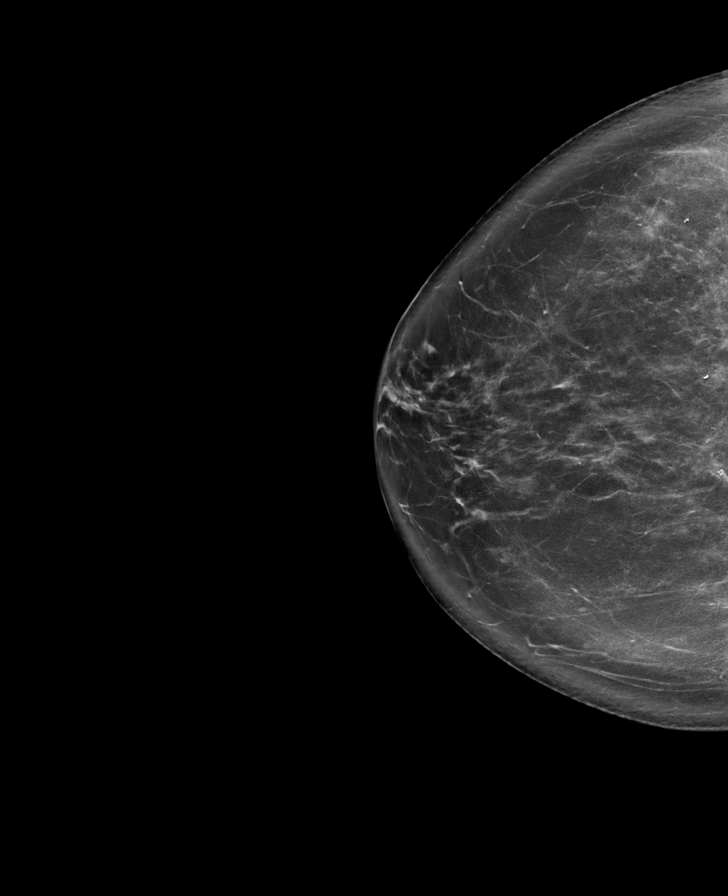

[L MLO synth-2D]
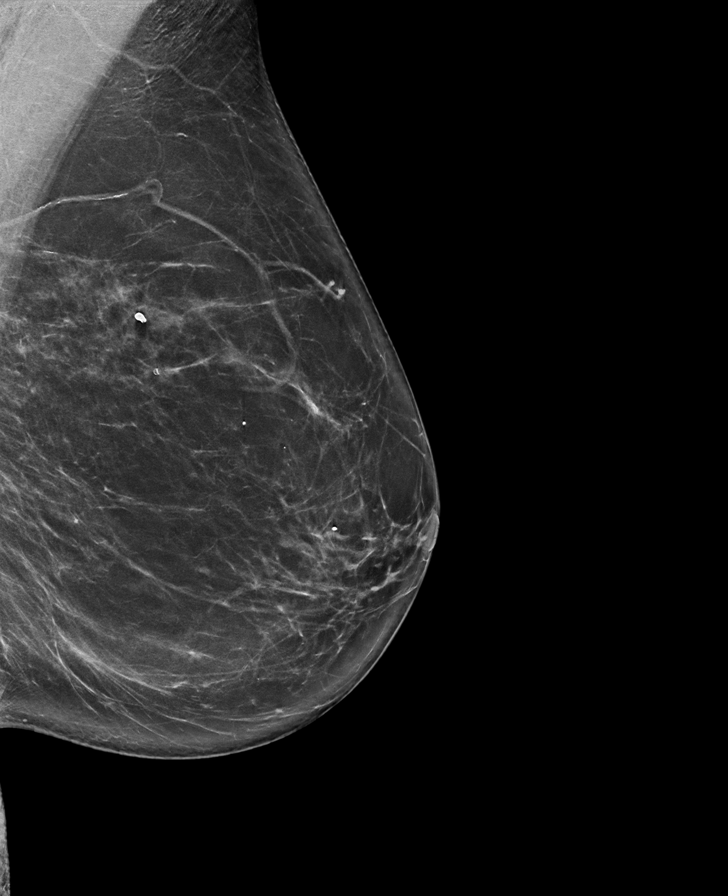

[L MLO tomo · tomo slice 43/84.0]
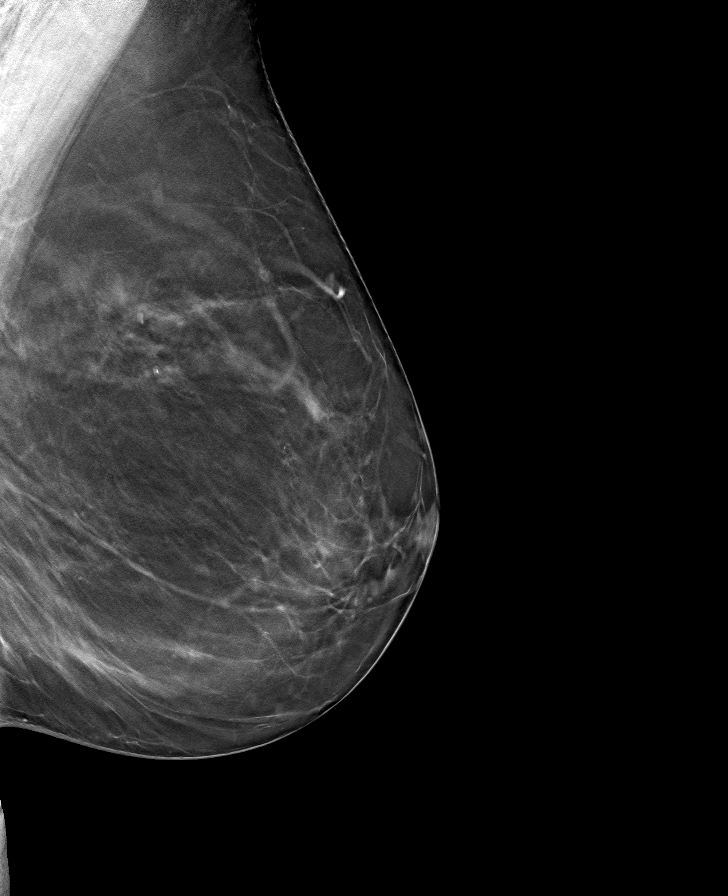

[R CC tomo · tomo slice 43/86.0]
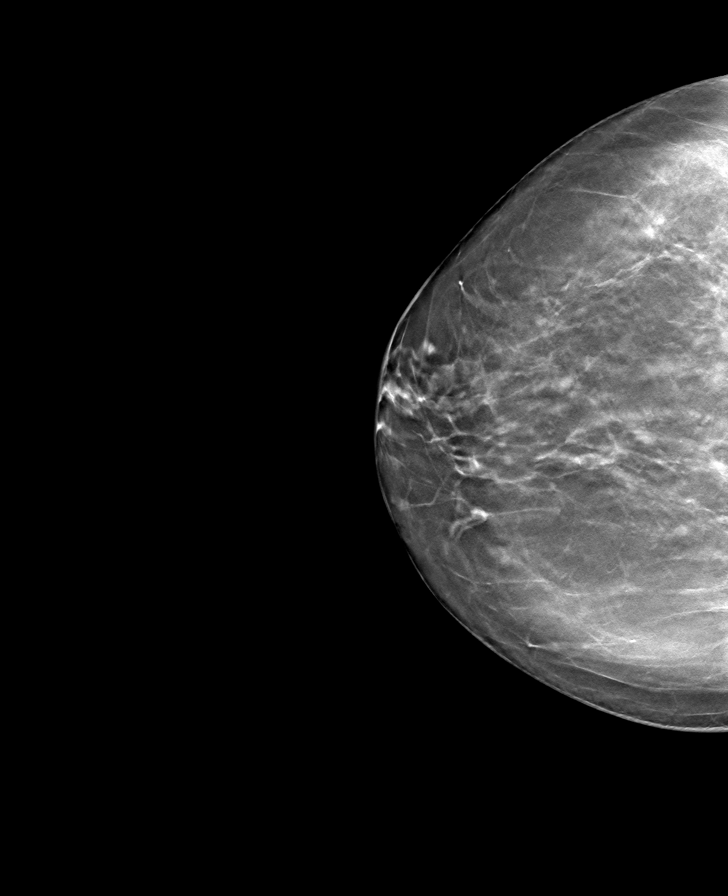

[R MLO tomo · tomo slice 45/88.0]
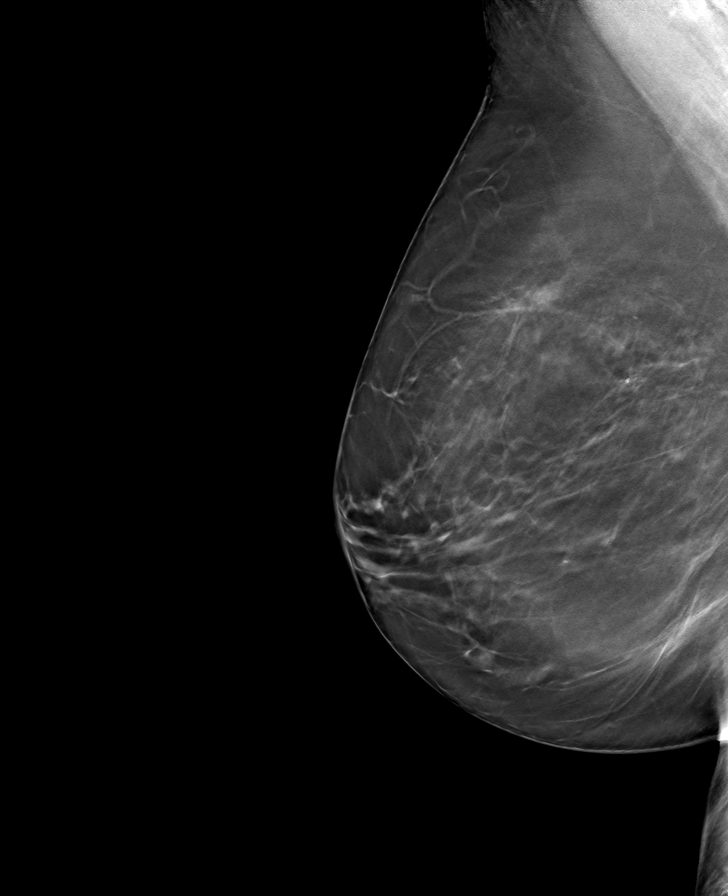

[L CC tomo · tomo slice 45/88.0]
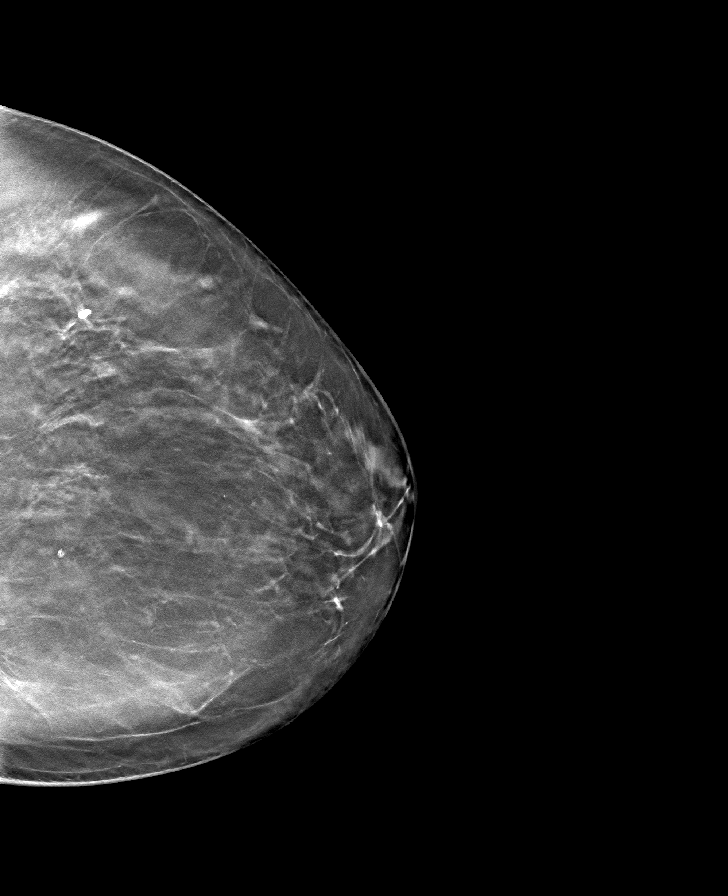

[8 of 24 positions shown; findings below may reference images not displayed]

ACR Breast Density Category b: There are scattered areas of
fibroglandular density.
FINDINGS: There are no findings suspicious for malignancy. Images were
processed with CAD.
IMPRESSION: No mammographic evidence of malignancy. A result letter of this
screening mammogram will be mailed directly to the patient.

RECOMMENDATION:
Screening mammogram in one year. (Code:[id number])

BI-RADS CATEGORY  1: Negative.

## 2021-05-28 ENCOUNTER — Other Ambulatory Visit: Payer: Self-pay | Admitting: Family Medicine

## 2021-05-28 DIAGNOSIS — Z1231 Encounter for screening mammogram for malignant neoplasm of breast: Secondary | ICD-10-CM

## 2021-06-22 NOTE — Progress Notes (Signed)
Chief Complaint  Patient presents with   Gynecologic Exam     HPI:      [name] is a 71 y.o. G2P2002 who LMP was No LMP recorded. Patient is postmenopausal., presents today for her MEDICARE breast/pelvic examination. Her menses are absent due to menopause.  She does not have PMB. No vasomotor sx. Hx of mild uterine prolapse/cystocele 2/20.   Sex activity: not sexually active. She does have vaginal dryness. [name]   She has a hx of LS and uses ultravate oint 1-2 times weekly with sx relief. Rx RF due. Needs yearly f/u. Has 1 area perineal area that feels like it is going to tear.    Last Pap: 10/27/17  Results were: no abnormalities /neg HPV DNA.    Last mammogram: 06/20/20  Results were: normal--routine follow-up in 12 months; has appt 3/23 There is a FH of breast cancer in her mom and mat aunt, genetic testing not indicated. There is no FH of ovarian cancer. The patient does self-breast exams.   Colonoscopy: colonoscopy 2015 without abnormalities. Repeat due after 10 yrs. Pt initially thought 5 yrs due to [place] polyps in sister, but GI said 10 yrs. Sched by PCP.  DEXA: osteoporosis by PCP, followed by PCP; tx declined in past   Tobacco use: The patient denies current or previous tobacco use. Alcohol use: none No drug use Exercise: moderately active   She does get adequate calcium and Vitamin D in her diet.   Labs with PCP   Past Medical History:  Diagnosis Date   Arthritis    Bursitis    Family history of breast cancer    mother, aunt   GERD (gastroesophageal reflux disease)    History of hiatal hernia    History of kidney stones    Hyperlipidemia    Osteoporosis    Poor circulation    Schwannoma    Shortness of breath     Past Surgical History:  Procedure Laterality Date   BREAST EXCISIONAL BIOPSY Right  2003   NEG   COLONOSCOPY  01/2014   CYSTOSCOPY/URETEROSCOPY/HOLMIUM LASER/STENT PLACEMENT Left 06/24/2020   Procedure: CYSTOSCOPY/URETEROSCOPY/HOLMIUM LASER/STENT PLACEMENT;  Surgeon: Hollice Espy, MD;  Location: [facility] ORS;  Service: Urology;  Laterality: Left;   tumor biopsy  09/2010   VEIN SURGERY  04/2020   DONE IN OFFICE   VULVA /PERINEUM BIOPSY      Family History  Problem Relation Age of Onset   Other Mother        left mastectomy 1998   Osteoporosis Mother    Breast cancer Mother 18   Uterine cancer Mother 45   Breast cancer Maternal Aunt 55    Social History   Socioeconomic History   Marital status: Married    Spouse name: Not on file   Number of children: Not on file   Years of education: Not on file   Highest education level: Not on file  Occupational History   Not on file  Tobacco Use   Smoking status: Never   Smokeless tobacco: Never  Vaping Use   Vaping Use: Never used  Substance and Sexual Activity   Alcohol use: No   Drug use: No   Sexual activity: Not Currently    Birth control/protection: Post-menopausal  Other Topics Concern   Not on file  Social  History Narrative   Not on file   Social Determinants of Health   Financial Resource Strain: Not on file  Food Insecurity: Not on file  Transportation Needs: Not on file  Physical Activity: Not on file  Stress: Not on file  Social Connections: Not on file  Intimate Partner Violence: Not on file    Current Outpatient Medications on File Prior to Visit  Medication Sig Dispense Refill   acetaminophen (TYLENOL) 500 MG tablet Take 500-1,000 mg by mouth every 6 (six) hours as needed (pain).     aspirin EC 81 MG tablet Take 81 mg by mouth daily. Swallow whole.     atorvastatin (LIPITOR) 40 MG tablet Take 40 mg by mouth every evening.  11   cetirizine (ZYRTEC) 10 MG tablet Take 10 mg by mouth daily as needed for allergies.     Cholecalciferol 25 MCG (1000 UT) tablet Take 1,000 Units by mouth daily.      CRANBERRY PO Take by mouth.     denosumab (PROLIA) 60 MG/ML SOLN injection Inject 60 mg into the skin every 6 (six) months.     fluticasone (FLONASE) 50 MCG/ACT nasal spray Place 1 spray into both nostrils daily as needed for allergies.     Multiple Vitamin (MULTIVITAMIN WITH MINERALS) TABS tablet Take 1 tablet by mouth daily. One A Day Women's 50+     omeprazole (PRILOSEC) 20 MG capsule Take 20 mg by mouth every morning.     Current Facility-Administered Medications on File Prior to Visit  Medication Dose Route Frequency Provider Last Rate Last Admin   triamcinolone acetonide (KENALOG) 10 MG/ML injection 10 mg  10 mg Other Once Landis Martins, DPM          ROS:  Review of Systems  Constitutional:  Negative for fatigue, fever and unexpected weight change.  Respiratory:  Negative for cough, shortness of breath and wheezing.   Cardiovascular:  Negative for chest pain, palpitations and leg swelling.  Gastrointestinal:  Negative for blood in stool, constipation, diarrhea, nausea and vomiting.  Endocrine: Negative for cold intolerance, heat intolerance and polyuria.  Genitourinary:  Negative for dyspareunia, dysuria, flank pain, frequency, genital sores, hematuria, menstrual problem, pelvic pain, urgency, vaginal bleeding, vaginal discharge and vaginal pain.  Musculoskeletal:  Negative for back pain, joint swelling and myalgias.  Skin:  Negative for rash.  Neurological:  Negative for dizziness, syncope, light-headedness, numbness and headaches.  Hematological:  Negative for adenopathy.  Psychiatric/Behavioral:  Negative for agitation, confusion, sleep disturbance and suicidal ideas. The patient is not nervous/anxious.     Objective: BP 118/60    Ht '5\' 6"'$  (1.676 m)    Wt 176 lb (79.8 kg)    BMI 28.41 kg/m    Physical Exam Constitutional:      Appearance: She is well-developed.  Genitourinary:     Vulva normal.     Genitourinary Comments: LS UNDER CONTROL; MILD, GRADE 1 CYSTOCELE      Right Labia: No rash, tenderness or lesions.    Left Labia: No tenderness, lesions or rash.       No vaginal discharge, erythema or tenderness.      Right Adnexa: not tender and no mass present.    Left Adnexa: not tender and no mass present.    No cervical motion tenderness, friability or polyp.     Uterus is not enlarged or tender.  Breasts:    Right: No mass, nipple discharge, skin change or tenderness.     Left: No mass,  nipple discharge, skin change or tenderness.  Neck:     Thyroid: No thyromegaly.  Cardiovascular:     Rate and Rhythm: Normal rate and regular rhythm.     Heart sounds: Normal heart sounds. No murmur heard. Pulmonary:     Effort: Pulmonary effort is normal.     Breath sounds: Normal breath sounds.  Abdominal:     Palpations: Abdomen is soft.     Tenderness: There is no abdominal tenderness. There is no guarding or rebound.  Musculoskeletal:        General: Normal range of motion.     Cervical back: Normal range of motion.  Lymphadenopathy:     Cervical: No cervical adenopathy.  Neurological:     General: No focal deficit present.     Mental Status: She is alert and oriented to person, place, and time.     Cranial Nerves: No cranial nerve deficit.  Skin:    General: Skin is warm and dry.  Psychiatric:        Mood and Affect: Mood normal.        Behavior: Behavior normal.        Thought Content: Thought content normal.        Judgment: Judgment normal.  Vitals reviewed.    Assessment/Plan: Encounter for annual routine gynecological examination - Plan: MEDICARE breast/pelvic  Lichen sclerosus - Plan: halobetasol (ULTRAVATE) 0.05 % ointment; Rx RF. Using 1-2 times wkly with sx control; recheck in 1 yr due to slight risk of SCC  Try 1 sample prem vag crm to perineal area since tissue feels thin to pt. Normal on exam except mod atrophy. F/u prn.   Encounter for screening mammogram for malignant neoplasm of breast--pt has mammo appt.   Meds ordered  this encounter  Medications   halobetasol (ULTRAVATE) 0.05 % ointment    Sig: AAA 1-2 times weekly as maintenance    Dispense:  50 g    Refill:  0    Order Specific Question:   Supervising Provider    Answer:   Gae Dry [[id number]]             GYN counsel breast self exam, mammography screening, menopause, adequate intake of calcium and vitamin D, diet and exercise     F/U  Return in about 1 year (around 06/24/2022) for LS f/u (annual due Q2 yrs). annual  [place] B. Elia Nunley, PA-C 06/23/2021 1:36 PM

## 2021-06-23 ENCOUNTER — Encounter: Payer: Self-pay | Admitting: Obstetrics and Gynecology

## 2021-06-23 ENCOUNTER — Other Ambulatory Visit: Payer: Self-pay

## 2021-06-23 ENCOUNTER — Ambulatory Visit (INDEPENDENT_AMBULATORY_CARE_PROVIDER_SITE_OTHER): Payer: Medicare Other | Admitting: Obstetrics and Gynecology

## 2021-06-23 VITALS — BP 118/60 | Ht 66.0 in | Wt 176.0 lb

## 2021-06-23 DIAGNOSIS — Z01419 Encounter for gynecological examination (general) (routine) without abnormal findings: Secondary | ICD-10-CM | POA: Diagnosis not present

## 2021-06-23 DIAGNOSIS — L900 Lichen sclerosus et atrophicus: Secondary | ICD-10-CM

## 2021-06-23 DIAGNOSIS — Z1231 Encounter for screening mammogram for malignant neoplasm of breast: Secondary | ICD-10-CM

## 2021-06-23 DIAGNOSIS — Z1211 Encounter for screening for malignant neoplasm of colon: Secondary | ICD-10-CM

## 2021-06-23 MED ORDER — HALOBETASOL PROPIONATE 0.05 % EX OINT: TOPICAL_OINTMENT | CUTANEOUS | 0 refills | Status: DC

## 2021-06-23 NOTE — Patient Instructions (Signed)
I value your feedback and you entrusting us with your care. If you get a [facility] patient survey, I would appreciate you taking the time to let us know about your experience today. Thank you! ? ? ?

## 2021-07-03 ENCOUNTER — Ambulatory Visit
Admission: RE | Admit: 2021-07-03 | Discharge: 2021-07-03 | Disposition: A | Discharge status: Home / Self Care | Payer: Medicare Other | Source: Ambulatory Visit | Attending: Family Medicine | Admitting: Family Medicine

## 2021-07-03 ENCOUNTER — Other Ambulatory Visit: Payer: Self-pay

## 2021-07-03 DIAGNOSIS — Z1231 Encounter for screening mammogram for malignant neoplasm of breast: Secondary | ICD-10-CM | POA: Diagnosis not present

## 2021-08-04 NOTE — Progress Notes (Incomplete)
08/04/21 ?7:50 PM  ? ?Emily Richard ?01-23-1951 ?[id number] ? ?Referring provider:  ?[name], MD ?[place] ?[facility] ?[place],  [place] [address] ?No chief complaint on file. ? ? ?Urological history  ? ? ? ?HPI: ?Emily Richard is a 71 y.o.female ? ? ? ? ? ?PMH: ?Past Medical History:  ?Diagnosis Date  ? Arthritis   ? Bursitis   ? Family history of breast cancer   ? mother, aunt  ? GERD (gastroesophageal reflux disease)   ? History of hiatal hernia   ? History of kidney stones   ? Hyperlipidemia   ? Osteoporosis   ? Poor circulation   ? Schwannoma   ? Shortness of breath   ? ? ?Surgical History: ?Past Surgical History:  ?Procedure Laterality Date  ? BREAST EXCISIONAL BIOPSY Right 2003  ? NEG  ? COLONOSCOPY  01/2014  ? CYSTOSCOPY/URETEROSCOPY/HOLMIUM LASER/STENT PLACEMENT Left 06/24/2020  ? Procedure: CYSTOSCOPY/URETEROSCOPY/HOLMIUM LASER/STENT PLACEMENT;  Surgeon: Hollice Espy, MD;  Location: [facility] ORS;  Service: Urology;  Laterality: Left;  ? tumor biopsy  09/2010  ? VEIN SURGERY  04/2020  ? DONE IN OFFICE  ? VULVA /PERINEUM BIOPSY    ? ? ?Home Medications:  ?Allergies as of 08/07/2021   ? ?   Reactions  ? Penicillins Hives, Itching, Rash  ? All over body. ?All over body.  ? ?  ? ?  ?Medication List  ?  ? ?  ? Accurate as of August 04, 2021  7:50 PM. If you have any questions, ask your nurse or doctor.  ?  ?  ? ?  ? ?acetaminophen 500 MG tablet ?Commonly known as: TYLENOL ?Take 500-1,000 mg by mouth every 6 (six) hours as needed (pain). ?  ?aspirin EC 81 MG tablet ?Take 81 mg by mouth daily. Swallow whole. ?  ?atorvastatin 40 MG tablet ?Commonly known as: LIPITOR ?Take 40 mg by mouth every evening. ?  ?cetirizine 10 MG tablet ?Commonly known as: ZYRTEC ?Take 10 mg by mouth daily as needed for allergies. ?  ?Cholecalciferol 25 MCG (1000 UT) tablet ?Take 1,000 Units by mouth daily. ?  ?CRANBERRY PO ?Take by mouth. ?  ?denosumab 60 MG/ML Soln injection ?Commonly known as: PROLIA ?Inject 60 mg into  the skin every 6 (six) months. ?  ?fluticasone 50 MCG/ACT nasal spray ?Commonly known as: FLONASE ?Place 1 spray into both nostrils daily as needed for allergies. ?  ?halobetasol 0.05 % ointment ?Commonly known as: ULTRAVATE ?AAA 1-2 times weekly as maintenance ?  ?multivitamin with minerals Tabs tablet ?Take 1 tablet by mouth daily. One A Day Women's 50+ ?  ?omeprazole 20 MG capsule ?Commonly known as: PRILOSEC ?Take 20 mg by mouth every morning. ?  ? ?  ? ? ?Allergies:  ?Allergies  ?Allergen Reactions  ? Penicillins Hives, Itching and Rash  ?  All over body. ?All over body.  ? ? ?Family History: ?Family History  ?Problem Relation Age of Onset  ? Other Mother   ?     left mastectomy 1998  ? Osteoporosis Mother   ? Breast cancer Mother 82  ? Uterine cancer Mother 98  ? Breast cancer Maternal Aunt 30  ? ? ?Social History:  reports that she has never smoked. She has never used smokeless tobacco. She reports that she does not drink alcohol and does not use drugs. ? ? ?Physical Exam: ?There were no vitals taken for this visit.  ?Constitutional:  Alert and oriented, No acute distress. ?HEENT:  [place] AT, moist mucus membranes.  Trachea midline, no masses. ?Cardiovascular: No clubbing, cyanosis, or edema. ?Respiratory: Normal respiratory effort, no increased work of breathing. ?GI: Abdomen is soft, nontender, nondistended, no abdominal masses ?GU: No CVA tenderness ?Lymph: No cervical or inguinal lymphadenopathy. ?Skin: No rashes, bruises or suspicious lesions. ?Neurologic: Grossly intact, no focal deficits, moving all 4 extremities. ?Psychiatric: Normal mood and affect. ? ?Laboratory Data: ? ?Lab Results  ?Component Value Date  ? CREATININE 0.70 05/24/2019  ? ? ? ?No results found for: HGBA1C ? ?Urinalysis ? ? ?Pertinent Imaging: ? ? ?Assessment & Plan:   ? ? ?No follow-ups on file. ? ?[name] ?[address] ?[place], [place] [address] ?[phone] ? ?I,[name],acting as a  [name], PA-C.,have documented all relevant documentation on the behalf of [name], PA-C,as directed by  [facility], PA-C while in the presence of [name], PA-C. ? ?

## 2021-08-07 ENCOUNTER — Ambulatory Visit: Payer: Medicare Other | Admitting: Urology

## 2021-09-01 NOTE — Progress Notes (Signed)
09/02/21 9:51 AM   [name] [birth year] [id number]  Referring provider:  [name], MD [address] [facility] [place],  [place] [address]  Chief Complaint  Patient presents with   Nephrolithiasis   Urological history: 1. High risk hematuria -non-smoker -CTU 2021 Mild right hydronephrosis and hydroureter extending down to a 4 by 2 by 2 mm in long axis right UVJ calculus.  Nonobstructive 0.8 cm in long axis left kidney lower pole renal calculus -cystoscopy 2021 NED -no reports of gross heme -UA negative for micro heme 02/2021   2. Nephrolithiasis -most recent episode 06/2020 for left URS -stone composition 80% calcium oxalate dihydrate, 10% calcium oxalate monohydrate and 10% and 10% hydroxyapatite   3. rUTI's -contributing factors of age and vaginal atrophy -documented positive urine cultures over the last year             None   HPI: Emily Richard is a 71 y.o.female who presents today for a one month follow up.    She is attempting to increase water in her diet.  Patient denies any modifying or aggravating factors.  Patient denies any gross hematuria, dysuria or suprapubic/flank pain.  Patient denies any fevers, chills, nausea or vomiting.    Specific gravity 1.010 in 02/2021  KUB no stone seen when comparing it to the CT urogram from 2021 as calcifications seen on KUB live outside the urinary tract.  PMH: Past Medical History:  Diagnosis Date   Arthritis    Bursitis    Family history of breast cancer    mother, aunt   GERD (gastroesophageal reflux disease)    History of hiatal hernia    History of kidney stones    Hyperlipidemia    Osteoporosis    Poor circulation    Schwannoma    Shortness of breath     Surgical History: Past Surgical History:  Procedure Laterality Date   BREAST EXCISIONAL BIOPSY Right 2003   NEG   COLONOSCOPY  01/2014   CYSTOSCOPY/URETEROSCOPY/HOLMIUM LASER/STENT PLACEMENT Left 06/24/2020   Procedure:  CYSTOSCOPY/URETEROSCOPY/HOLMIUM LASER/STENT PLACEMENT;  Surgeon: Hollice Espy, MD;  Location: [facility] ORS;  Service: Urology;  Laterality: Left;   tumor biopsy  09/2010   VEIN SURGERY  04/2020   DONE IN OFFICE   VULVA Milagros Loll BIOPSY      Home Medications:  Allergies as of 09/02/2021       Reactions   Penicillins Hives, Itching, Rash   All over body. All over body.        Medication List        Accurate as of Sep 02, 2021  9:51 AM. If you have any questions, ask your nurse or doctor.          acetaminophen 500 MG tablet Commonly known as: TYLENOL Take 500-1,000 mg by mouth every 6 (six) hours as needed (pain).   aspirin EC 81 MG tablet Take 81 mg by mouth daily. Swallow whole.   atorvastatin 40 MG tablet Commonly known as: LIPITOR Take 40 mg by mouth every evening.   cetirizine 10 MG tablet Commonly known as: ZYRTEC Take 10 mg by mouth daily as needed for allergies.   Cholecalciferol 25 MCG (1000 UT) tablet Take 1,000 Units by mouth daily.   CRANBERRY PO Take by mouth.   denosumab 60 MG/ML Soln injection Commonly known as: PROLIA Inject 60 mg into the skin every 6 (six) months.   fluticasone 50 MCG/ACT nasal spray Commonly known as: FLONASE Place 1 spray  into both nostrils daily as needed for allergies.   halobetasol 0.05 % ointment Commonly known as: ULTRAVATE AAA 1-2 times weekly as maintenance   multivitamin with minerals Tabs tablet Take 1 tablet by mouth daily. One A Day Women's 50+   omeprazole 20 MG capsule Commonly known as: PRILOSEC Take 20 mg by mouth every morning.        Allergies:  Allergies  Allergen Reactions   Penicillins Hives, Itching and Rash    All over body. All over body.    Family History: Family History  Problem Relation Age of Onset   Other Mother        left mastectomy 1998   Osteoporosis Mother    Breast cancer Mother 35   Uterine cancer Mother 65   Breast cancer Maternal Aunt 57    Social History:   reports that she has never smoked. She has never used smokeless tobacco. She reports that she does not drink alcohol and does not use drugs.   Physical Exam: BP 121/75   Pulse 62   Ht 5' 6" (1.676 m)   Wt 172 lb (78 kg)   BMI 27.76 kg/m   Constitutional:  Well nourished. Alert and oriented, No acute distress. HEENT: [place] AT, moist mucus membranes.  Trachea midline, no masses. Cardiovascular: No clubbing, cyanosis, or edema. Respiratory: Normal respiratory effort, no increased work of breathing. Neurologic: Grossly intact, no focal deficits, moving all 4 extremities. Psychiatric: Normal mood and affect.    Laboratory Data: WBC (White Blood Cell Count) 4.1 - 10.2 10^3/uL 4.0 Low    RBC (Red Blood Cell Count) 4.04 - 5.48 10^6/uL 4.32   Hemoglobin 12.0 - 15.0 gm/dL 13.5   Hematocrit 35.0 - 47.0 % 41.5   MCV (Mean Corpuscular Volume) 80.0 - 100.0 fl 96.1   MCH (Mean Corpuscular Hemoglobin) 27.0 - 31.2 pg 31.3 High    MCHC (Mean Corpuscular Hemoglobin Concentration) 32.0 - 36.0 gm/dL 32.5   Platelet Count 150 - 450 10^3/uL 198   RDW-CV (Red Cell Distribution Width) 11.6 - 14.8 % 12.6   MPV (Mean Platelet Volume) 9.4 - 12.4 fl 9.4   Neutrophils 1.50 - 7.80 10^3/uL 2.16   Lymphocytes 1.00 - 3.60 10^3/uL 1.21   Monocytes 0.00 - 1.50 10^3/uL 0.45   Eosinophils 0.00 - 0.55 10^3/uL 0.10   Basophils 0.00 - 0.09 10^3/uL 0.04   Neutrophil % 32.0 - 70.0 % 54.4   Lymphocyte % 10.0 - 50.0 % 30.5   Monocyte % 4.0 - 13.0 % 11.3   Eosinophil % 1.0 - 5.0 % 2.5   Basophil% 0.0 - 2.0 % 1.0   Immature Granulocyte % <=0.7 % 0.3   Immature Granulocyte Count <=0.06 10^3/L 0.01   Resulting Agency  [facility] - [facility]  Specimen Collected: 03/11/21 08:30 Last Resulted: 03/11/21 10:49  Received From: [facility]  Result Received: 05/28/21 10:39   Glucose 70 - 110 mg/dL 87   Sodium 136 - 145 mmol/L 142   Potassium 3.6 - 5.1 mmol/L 4.3   Chloride 97 - 109 mmol/L 105   Carbon  Dioxide (CO2) 22.0 - 32.0 mmol/L 32.1 High    Urea Nitrogen (BUN) 7 - 25 mg/dL 12   Creatinine 0.6 - 1.1 mg/dL 0.7   Glomerular Filtration Rate (eGFR), MDRD Estimate >60 mL/min/1.73sq m 83   Calcium 8.7 - 10.3 mg/dL 9.1   AST  8 - 39 U/L 28   ALT  5 - 38 U/L 23  Alk Phos (alkaline Phosphatase) 34 - 104 U/L 82   Albumin 3.5 - 4.8 g/dL 4.1   Bilirubin, Total 0.3 - 1.2 mg/dL 0.9   Protein, Total 6.1 - 7.9 g/dL 6.5   A/G Ratio 1.0 - 5.0 gm/dL 1.7   Resulting Agency  [facility] - [facility]  Specimen Collected: 03/11/21 08:23 Last Resulted: 03/11/21 11:44  Received From: [facility]  Result Received: 05/28/21 10:39   Color Yellow, Violet, Light Violet, Dark Violet Yellow   Clarity Clear, Other Clear   Specific Gravity 1.000 - 1.030 1.010   pH, Urine 5.0 - 8.0 7.0   Protein, Urinalysis Negative, Trace mg/dL Negative   Glucose, Urinalysis Negative mg/dL Negative   Ketones, Urinalysis Negative mg/dL Negative   Blood, Urinalysis Negative Negative   Nitrite, Urinalysis Negative Negative   Leukocyte Esterase, Urinalysis Negative Negative   White Blood Cells, Urinalysis None Seen, 0-3 /hpf 0-3   Red Blood Cells, Urinalysis None Seen, 0-3 /hpf None Seen   Bacteria, Urinalysis None Seen /hpf Few Abnormal    Squamous Epithelial Cells, Urinalysis Rare, Few, None Seen /hpf Few   Resulting Agency  [facility] - [facility]  Narrative Performed by [facility] - [facility] Trace mucus  Specimen Collected: 03/11/21 08:23 Last Resulted: 03/11/21 15:53  Received From: [facility]  Result Received: 05/28/21 10:39   Hemoglobin A1C 4.2 - 5.6 % 5.9 High    Average Blood Glucose (Calc) mg/dL [facility] - [facility]  Narrative Performed by [facility] - [facility] Normal Range:    4.2 - 5.6%  Increased Risk:  5.7 - 6.4%  Diabetes:        >= 6.5%  Glycemic Control for adults with diabetes:  <7%   Specimen Collected: 11/12/20  08:28 Last Resulted: 11/12/20 13:12  Received From: [facility]  Result Received: 05/28/21 10:39  I have reviewed the labs.    Pertinent Imaging: CLINICAL DATA:  Kidney stones.   EXAM: ABDOMEN - 1 VIEW   COMPARISON:  May 22, 2020   FINDINGS: The bowel gas pattern is normal. 7 mm calcific density projects over the midpole right kidney. There is a 3 mm calcific density in the right pelvis, stone in the distal right ureter is not excluded. Right-sided pelvic phleboliths are stable.   IMPRESSION: 7 mm calcific density projects over the midpole right kidney. There is a 3 mm calcific density in the right pelvis, stone in the distal right ureter is not excluded.     Electronically Signed   By: Abelardo Diesel M.D.   On: 09/03/2021 10:35 I have independently reviewed the films.  See HPI.    Assessment & Plan:    1. High risk hematuria -non smoker -work up 2021 - positive for nephrolithiasis -no reports of gross heme -UA negative for micro heme 02/2021  2. Nephrolithiasis -KUB no stones seen --" Normal" and " Optimal" 24-hour urine volumes are 2000 to 2500 mL, respectively -This is slightly over half a gallon or 4 pints daily -One half of fluid should be water only -Avoid using electrolyte sports drinks and similar products as they contain too much sodium -Cranberry juice is not recommended, but if needed to help prevent recurrent UTI's, a glass or 2 a day is not a problem as long as the goal of 2000 to 2500 mL of fluid is obtained with half of it being water -A good substitute for water  is lemonade made with real lemon juice as lemon juice is high in citrate and orange juice is high in citrate as well, but will need to be drink sparingly if diabetic -Flavoring agents for water are not ideal, but look for flavoring agents that have citric acid as its main ingredient and is low in sodium -Substituting high fluid content desserts such as frozen ices, sherbet,  melons, grapes and other fruits in place of cookies, pastries and cakes is helpful -Maintaining a humidity level in the home and workplace between 40% to 45% to minimize insensible fluid loss through the skin and normal respiration -Limit salt and sodium intake -Optimal urinary specific gravity reading should consistently be 1.005 or less -drink enough urine so that the urine does not appear darker than very pale yellow -Refer to "Penalty List" for further ideas to increase fluid intake   Return in about 1 year (around 09/03/2022) for KUB and office visit .  Zara Council, PA-C   [facility] [address] [place],  [address] [phone]  I spent 15 minutes on the day of the encounter to include pre-visit record review, face-to-face time with the patient, and post-visit ordering of tests.

## 2021-09-02 ENCOUNTER — Ambulatory Visit
Admission: RE | Admit: 2021-09-02 | Discharge: 2021-09-02 | Disposition: A | Discharge status: Home / Self Care | Payer: Medicare Other | Attending: Urology | Admitting: Urology

## 2021-09-02 ENCOUNTER — Ambulatory Visit (INDEPENDENT_AMBULATORY_CARE_PROVIDER_SITE_OTHER): Payer: Medicare Other | Admitting: Urology

## 2021-09-02 ENCOUNTER — Encounter: Payer: Self-pay | Admitting: Urology

## 2021-09-02 ENCOUNTER — Ambulatory Visit
Admission: RE | Admit: 2021-09-02 | Discharge: 2021-09-02 | Disposition: A | Discharge status: Home / Self Care | Payer: Medicare Other | Source: Ambulatory Visit | Attending: Urology | Admitting: Urology

## 2021-09-02 ENCOUNTER — Other Ambulatory Visit: Payer: Self-pay

## 2021-09-02 VITALS — BP 121/75 | HR 62 | Ht 66.0 in | Wt 172.0 lb

## 2021-09-02 DIAGNOSIS — N200 Calculus of kidney: Secondary | ICD-10-CM | POA: Insufficient documentation

## 2021-09-02 DIAGNOSIS — Z87442 Personal history of urinary calculi: Secondary | ICD-10-CM

## 2021-09-02 DIAGNOSIS — R319 Hematuria, unspecified: Secondary | ICD-10-CM

## 2021-09-02 DIAGNOSIS — Z87440 Personal history of urinary (tract) infections: Secondary | ICD-10-CM

## 2021-09-02 NOTE — Patient Instructions (Signed)
"  Penalty" List  Bathroom Penalty: An extra 4-ounce glass of water with every visit to the bathroom, regardless of the reason. Chair Penalty: Have a bottle of water and a 4-ounce glass next to their favorite chairs at home. Instruct the patient to drink one 4-ounce glass before getting up from the chair. Cheating Penalty: If they eat or drink a restricted item beyond the allowable limit, one extra glass of water. Kitchen Penalty: One glass of water whenever they walk into their kitchen. Leaving Home Penalty. One extra glass of water prior to leaving home and another when they return. Meal Penalty: One extra glass with each meal except when they eat out, where they will need two extra glasses. Medication Penalty: One extra glass of water when taking medications. This is in addition to whatever patients are instructed to take normally with their medications. Nighttime Bathroom Use Penalty: One glass of water whenever they get out of bed to go to the bathroom at night. Snack Penalty: One extra glass of water if they have a snack between meals or bedtime. Summertime Penalty: Double all other penalties during the summer or when outside temperatures exceed 85 degrees. Time Penalty: One glass of water if the patient has managed to avoid all other "penalties" for 2 hours during the daytime. Water Fountain Penalty: They must drink at least 5 swallows whenever they pass a water fountain. Work Penalty: One glass of water whenever they leave their main desk or workplace. Patients should always have water and a 4-ounce glass available on their desks, in their cars, or within easy reach.  

## 2021-10-29 ENCOUNTER — Ambulatory Visit (INDEPENDENT_AMBULATORY_CARE_PROVIDER_SITE_OTHER): Payer: Medicare Other | Admitting: Physician Assistant

## 2021-10-29 VITALS — BP 103/67 | HR 63 | Ht 66.0 in | Wt 172.0 lb

## 2021-10-29 DIAGNOSIS — N390 Urinary tract infection, site not specified: Secondary | ICD-10-CM

## 2021-10-29 MED ORDER — NITROFURANTOIN MONOHYD MACRO 100 MG PO CAPS: 100.0000 mg | ORAL_CAPSULE | Freq: Two times a day (BID) | ORAL | 0 refills | Status: AC

## 2021-10-29 NOTE — Progress Notes (Unsigned)
In and Out Catheterization  Patient is present today for a I & O catheterization due to dysuria. Patient was cleaned and prepped in a sterile fashion with betadine . A 14FR cath was inserted no complications were noted , 44m of urine return was noted, urine was light yellow in color. A clean urine sample was collected for urinalysis. Bladder was drained  and catheter was removed with out difficulty.    Performed by: CBradly BienenstockCMA

## 2021-10-30 LAB — URINALYSIS, COMPLETE
Bilirubin, UA: NEGATIVE
Glucose, UA: NEGATIVE
Ketones, UA: NEGATIVE
Nitrite, UA: NEGATIVE
Protein,UA: NEGATIVE
Specific Gravity, UA: 1.01 (ref 1.005–1.030)
Urobilinogen, Ur: 0.2 mg/dL (ref 0.2–1.0)
pH, UA: 6.5 (ref 5.0–7.5)

## 2021-10-30 LAB — MICROSCOPIC EXAMINATION: Bacteria, UA: NONE SEEN

## 2021-10-30 NOTE — Progress Notes (Signed)
10/29/2021 4:50 PM   Emily Richard May 07, 1950 [id number]  CC: Chief Complaint  Patient presents with   Dysuria   HPI: Emily Richard is a 71 y.o. female with PMH hematuria, nephrolithiasis, lichen sclerosus, and recurrent UTIs who presents today for evaluation of possible UTI.   Today she reports an approximate 6-day history of dysuria and urgency as well as intermittent gross hematuria.  She denies fever, chills, nausea, and vomiting.  She has been staying well-hydrated but not taking any medications for his symptoms  In-office catheterized UA today positive for 2+ blood and trace leukocyte esterase; urine microscopy with 11-30 WBCs/HPF and 11-30 RBCs/HPF.  PMH: Past Medical History:  Diagnosis Date   Arthritis    Bursitis    Family history of breast cancer    mother, aunt   GERD (gastroesophageal reflux disease)    History of hiatal hernia    History of kidney stones    Hyperlipidemia    Osteoporosis    Poor circulation    Schwannoma    Shortness of breath     Surgical History: Past Surgical History:  Procedure Laterality Date   BREAST EXCISIONAL BIOPSY Right 2003   NEG   COLONOSCOPY  01/2014   CYSTOSCOPY/URETEROSCOPY/HOLMIUM LASER/STENT PLACEMENT Left 06/24/2020   Procedure: CYSTOSCOPY/URETEROSCOPY/HOLMIUM LASER/STENT PLACEMENT;  Surgeon: Hollice Espy, MD;  Location: [facility] ORS;  Service: Urology;  Laterality: Left;   tumor biopsy  09/2010   VEIN SURGERY  04/2020   DONE IN OFFICE   VULVA Milagros Loll BIOPSY      Home Medications:  Allergies as of 10/29/2021       Reactions   Penicillins Hives, Itching, Rash   All over body. All over body.        Medication List        Accurate as of October 29, 2021 11:59 PM. If you have any questions, ask your nurse or doctor.          acetaminophen 500 MG tablet Commonly known as: TYLENOL Take 500-1,000 mg by mouth every 6 (six) hours as needed (pain).   aspirin EC 81 MG tablet Take 81 mg by mouth daily.  Swallow whole.   atorvastatin 40 MG tablet Commonly known as: LIPITOR Take 40 mg by mouth every evening.   cetirizine 10 MG tablet Commonly known as: ZYRTEC Take 10 mg by mouth daily as needed for allergies.   Cholecalciferol 25 MCG (1000 UT) tablet Take 1,000 Units by mouth daily.   CRANBERRY PO Take by mouth.   denosumab 60 MG/ML Soln injection Commonly known as: PROLIA Inject 60 mg into the skin every 6 (six) months.   fluticasone 50 MCG/ACT nasal spray Commonly known as: FLONASE Place 1 spray into both nostrils daily as needed for allergies.   halobetasol 0.05 % ointment Commonly known as: ULTRAVATE AAA 1-2 times weekly as maintenance   multivitamin with minerals Tabs tablet Take 1 tablet by mouth daily. One A Day Women's 50+   nitrofurantoin (macrocrystal-monohydrate) 100 MG capsule Commonly known as: MACROBID Take 1 capsule (100 mg total) by mouth 2 (two) times daily for 5 days. Started by: Debroah Loop, PA-C   omeprazole 20 MG capsule Commonly known as: PRILOSEC Take 20 mg by mouth every morning.   UNABLE TO FIND Take by mouth.        Allergies:  Allergies  Allergen Reactions   Penicillins Hives, Itching and Rash    All over body. All over body.    Family History: Family History  Problem Relation Age of Onset   Other Mother        left mastectomy 1998   Osteoporosis Mother    Breast cancer Mother 36   Uterine cancer Mother 10   Breast cancer Maternal Aunt 57    Social History:   reports that she has never smoked. She has never used smokeless tobacco. She reports that she does not drink alcohol and does not use drugs.  Physical Exam: BP 103/67   Pulse 63   Ht '5\' 6"'$  (1.676 m)   Wt 172 lb (78 kg)   BMI 27.76 kg/m   Constitutional:  Alert and oriented, no acute distress, nontoxic appearing HEENT: [place], AT Cardiovascular: No clubbing, cyanosis, or edema Respiratory: Normal respiratory effort, no increased work of breathing Skin:  No rashes, bruises or suspicious lesions Neurologic: Grossly intact, no focal deficits, moving all 4 extremities Psychiatric: Normal mood and affect  Laboratory Data: Results for orders placed or performed in visit on 10/29/21  Microscopic Examination   Urine  Result Value Ref Range   WBC, UA 11-30 (A) 0 - 5 /hpf   RBC, Urine 11-30 (A) 0 - 2 /hpf   Epithelial Cells (non renal) 0-10 0 - 10 /hpf   Bacteria, UA None seen None seen/Few  Urinalysis, Complete  Result Value Ref Range   Specific Gravity, UA 1.010 1.005 - 1.030   pH, UA 6.5 5.0 - 7.5   Color, UA Yellow Yellow   Appearance Ur Cloudy (A) Clear   Leukocytes,UA Trace (A) Negative   Protein,UA Negative Negative/Trace   Glucose, UA Negative Negative   Ketones, UA Negative Negative   RBC, UA 2+ (A) Negative   Bilirubin, UA Negative Negative   Urobilinogen, Ur 0.2 0.2 - 1.0 mg/dL   Nitrite, UA Negative Negative   Microscopic Examination See below:    Assessment & Plan:   1. Recurrent UTI Cath UA today notable for pyuria and microscopic hematuria.  Will start empiric Macrobid and send for culture for further evaluation.  We will plan for lab visit for UA in 1 week to prove resolution of hematuria.  If persistent, we discussed pursuing CT imaging for further evaluation. - Urinalysis, Complete - CULTURE, URINE COMPREHENSIVE - nitrofurantoin, macrocrystal-monohydrate, (MACROBID) 100 MG capsule; Take 1 capsule (100 mg total) by mouth 2 (two) times daily for 5 days.  Dispense: 10 capsule; Refill: 0  Return in about 1 week (around 11/05/2021) for Lab visit for UA.  Debroah Loop, PA-C  [facility] [address], [place],  [address] [phone]

## 2021-11-01 LAB — CULTURE, URINE COMPREHENSIVE

## 2021-11-06 ENCOUNTER — Other Ambulatory Visit: Payer: Self-pay | Admitting: *Deleted

## 2021-11-06 ENCOUNTER — Other Ambulatory Visit: Payer: Medicare Other

## 2021-11-06 DIAGNOSIS — R300 Dysuria: Secondary | ICD-10-CM

## 2021-11-06 DIAGNOSIS — R319 Hematuria, unspecified: Secondary | ICD-10-CM

## 2021-11-06 LAB — URINALYSIS, COMPLETE
Bilirubin, UA: NEGATIVE
Glucose, UA: NEGATIVE
Ketones, UA: NEGATIVE
Nitrite, UA: NEGATIVE
Protein,UA: NEGATIVE
RBC, UA: NEGATIVE
Specific Gravity, UA: 1.025 (ref 1.005–1.030)
Urobilinogen, Ur: 1 mg/dL (ref 0.2–1.0)
pH, UA: 5.5 (ref 5.0–7.5)

## 2021-11-06 LAB — MICROSCOPIC EXAMINATION

## 2021-11-06 NOTE — Addendum Note (Signed)
Addended by: Verlene Mayer A on: 11/06/2021 08:08 AM   Modules accepted: Orders

## 2022-02-16 ENCOUNTER — Encounter (INDEPENDENT_AMBULATORY_CARE_PROVIDER_SITE_OTHER): Payer: Self-pay

## 2022-04-21 ENCOUNTER — Encounter (INDEPENDENT_AMBULATORY_CARE_PROVIDER_SITE_OTHER): Payer: Medicare Other | Admitting: Ophthalmology

## 2022-04-21 DIAGNOSIS — H43813 Vitreous degeneration, bilateral: Secondary | ICD-10-CM

## 2022-04-21 DIAGNOSIS — H35341 Macular cyst, hole, or pseudohole, right eye: Secondary | ICD-10-CM | POA: Diagnosis not present

## 2022-04-21 DIAGNOSIS — H353132 Nonexudative age-related macular degeneration, bilateral, intermediate dry stage: Secondary | ICD-10-CM | POA: Diagnosis not present

## 2022-04-23 NOTE — H&P (Signed)
SHAWNTINA DIFFEE is an 72 y.o. female.   Chief Complaint:  Blurred vision right eye for 4 weeks HPI: Noted blurred vision right eye 4 weeks ago.  No pain  Past Medical History:  Diagnosis Date   Arthritis    Bursitis    Family history of breast cancer    mother, aunt   GERD (gastroesophageal reflux disease)    History of hiatal hernia    History of kidney stones    Hyperlipidemia    Osteoporosis    Poor circulation    Schwannoma    Shortness of breath     Past Surgical History:  Procedure Laterality Date   BREAST EXCISIONAL BIOPSY Right 2003   NEG   COLONOSCOPY  01/2014   CYSTOSCOPY/URETEROSCOPY/HOLMIUM LASER/STENT PLACEMENT Left 06/24/2020   Procedure: CYSTOSCOPY/URETEROSCOPY/HOLMIUM LASER/STENT PLACEMENT;  Surgeon: Hollice Espy, MD;  Location: [facility] ORS;  Service: Urology;  Laterality: Left;   tumor biopsy  09/2010   VEIN SURGERY  04/2020   DONE IN OFFICE   VULVA /PERINEUM BIOPSY      Family History  Problem Relation Age of Onset   Other Mother        left mastectomy 1998   Osteoporosis Mother    Breast cancer Mother 66   Uterine cancer Mother 64   Breast cancer Maternal Aunt 57   Social History:  reports that she has never smoked. She has never used smokeless tobacco. She reports that she does not drink alcohol and does not use drugs.  Allergies:  Allergies  Allergen Reactions   Penicillins Hives, Itching and Rash    All over body. All over body.    No medications prior to admission.    Review of systems otherwise negative  There were no vitals taken for this visit.  Physical exam: Mental status: oriented x3. Eyes: See eye exam associated with this date of surgery in media tab.  Scanned in by scanning center Ears, Nose, Throat: within normal limits Neck: Within Normal limits General: within normal limits Chest: Within normal limits Breast: deferred Heart: Within normal limits Abdomen: Within normal limits GU: deferred Extremities: within normal  limits Skin: within normal limits  Assessment/Plan Macular hole right eye Plan: To [facility] for Pars plana vitrectomy, laser, gas injection, internal limiting membrane peel, serum patch, right eye  Hayden Pedro 04/23/2022, 3:34 PM

## 2022-05-10 NOTE — Anesthesia Preprocedure Evaluation (Addendum)
Anesthesia Evaluation  Patient identified by MRN, date of birth, ID band Patient awake    Reviewed: Allergy & Precautions, NPO status , Patient's Chart, lab work & pertinent test results  Airway Mallampati: I  TM Distance: >3 FB Neck ROM: Full    Dental  (+) Teeth Intact, Dental Advisory Given   Pulmonary neg pulmonary ROS   Pulmonary exam normal breath sounds clear to auscultation       Cardiovascular negative cardio ROS Normal cardiovascular exam Rhythm:Regular Rate:Normal     Neuro/Psych negative neurological ROS  negative psych ROS   GI/Hepatic Neg liver ROS, hiatal hernia,GERD  Controlled,,  Endo/Other  negative endocrine ROS    Renal/GU negative Renal ROS  negative genitourinary   Musculoskeletal  (+) Arthritis , Osteoarthritis,    Abdominal   Peds  Hematology negative hematology ROS (+)   Anesthesia Other Findings   Reproductive/Obstetrics negative OB ROS                             Anesthesia Physical Anesthesia Plan  ASA: 2  Anesthesia Plan: General   Post-op Pain Management:    Induction: Intravenous  PONV Risk Score and Plan: 3 and Ondansetron, Midazolam and Treatment may vary due to age or medical condition  Airway Management Planned: Oral ETT  Additional Equipment: None  Intra-op Plan:   Post-operative Plan: Extubation in OR  Informed Consent: I have reviewed the patients History and Physical, chart, labs and discussed the procedure including the risks, benefits and alternatives for the proposed anesthesia with the patient or authorized representative who has indicated his/her understanding and acceptance.     Dental advisory given  Plan Discussed with: CRNA  Anesthesia Plan Comments:         Anesthesia Quick Evaluation

## 2022-05-11 ENCOUNTER — Other Ambulatory Visit: Payer: Self-pay

## 2022-05-11 NOTE — Progress Notes (Signed)
PCP - Dr. [name] Cardiologist - Dr. Hollice Espy  PPM/ICD - Denies  Chest x-ray - N/A EKG - DOS Stress Test - Denies ECHO - Denies Cardiac Cath - Denies  Sleep Study - Denies  Diabetes: Denies   Blood Thinner Instructions: N/A Aspirin Instructions: Follow surgeons instructions.  ERAS Protcol - No  COVID TEST- N/A   Anesthesia review: No  Patient denies shortness of breath, fever, cough and chest pain at PAT appointment   All instructions explained to the patient, with a verbal understanding of the material. Patient agrees to go over the instructions while at home for a better understanding. Patient also instructed to self quarantine after being tested for COVID-19. The opportunity to ask questions was provided.    Surgical Instructions    Your procedure is scheduled on Tuesday, May 12, 2022 at 11:30 AM.  Report to [facility] Main Entrance "A" at 9:00 A.M., then check in with the Admitting office.  Call this number if you have problems the morning of surgery:  [phone]   If you have any questions prior to your surgery date call [phone]: Open Monday-Friday 8am-4pm  *If you experience any cold or flu symptoms such as cough, fever, chills, shortness of breath, etc. between now and your scheduled surgery, please notify us.*    Remember:  Do not eat or drink after midnight the night before your surgery   Per Dr. West Pugh office, take all medications as prescribed. Do not take any medications day of surgery.  As of today, STOP taking any Aleve, Naproxen, Ibuprofen, Motrin, Advil, Goody's, BC's, all herbal medications, fish oil, and all vitamins.                     Do NOT Smoke (Tobacco/Vaping) for 24 hours prior to your procedure.  If you use a CPAP at night, you may bring your mask/headgear for your overnight stay.   Contacts, glasses, piercing's, hearing aid's, dentures or partials may not be worn into surgery, please bring cases for  these belongings.    For patients admitted to the hospital, discharge time will be determined by your treatment team.   Patients discharged the day of surgery will not be allowed to drive home, and someone needs to stay with them for 24 hours.  SURGICAL WAITING ROOM VISITATION Patients having surgery or a procedure may have two support people in the waiting area. Visitors may stay in the waiting area during the procedure and switch out with other visitors if needed. Only 1 support person is allowed in the pre-op area with the patient AFTER the patient is prepped. This person cannot be switched out. Children under the age of 58 must have an adult accompany them who is not the patient. If the patient needs to stay at the hospital during part of their recovery, the visitor guidelines for inpatient rooms apply.  Please refer to the [facility] website for the visitor guidelines for Inpatients (after your surgery is over and you are in a regular room).    Special instructions:   [facility]- Preparing For Surgery  Day of Surgery: Take a shower with mild soap. Do not wear jewelry or makeup Do not wear lotions, powders, perfumes/colognes, or deodorant. Do not shave 48 hours prior to surgery. Do not wear nail polish, gel polish, artificial nails, or any other type of covering on natural nails (fingers and toes) If you have artificial nails or gel coating that need to be removed by  a nail salon, please have this removed prior to surgery. Artificial nails or gel coating may interfere with anesthesia's ability to adequately monitor your vital signs. Wear Clean/Comfortable clothing the morning of surgery Do not bring valuables to the hospital.  [facility] is not responsible for any belongings or valuables. Remember to brush your teeth WITH YOUR REGULAR TOOTHPASTE.   Please read over the following fact sheets that you were given.  If you received a COVID test during your pre-op visit  it is  requested that you wear a mask when out in public, stay away from anyone that may not be feeling well and notify your surgeon if you develop symptoms. If you have been in contact with anyone that has tested positive in the last 10 days please notify you surgeon.

## 2022-05-12 ENCOUNTER — Ambulatory Visit (HOSPITAL_BASED_OUTPATIENT_CLINIC_OR_DEPARTMENT_OTHER): Payer: Medicare Other | Admitting: Anesthesiology

## 2022-05-12 ENCOUNTER — Encounter (INDEPENDENT_AMBULATORY_CARE_PROVIDER_SITE_OTHER): Payer: Medicare Other | Admitting: Ophthalmology

## 2022-05-12 ENCOUNTER — Ambulatory Visit (HOSPITAL_COMMUNITY): Payer: Medicare Other | Admitting: Anesthesiology

## 2022-05-12 ENCOUNTER — Ambulatory Visit (HOSPITAL_COMMUNITY)
Admission: RE | Admit: 2022-05-12 | Discharge: 2022-05-13 | Disposition: A | Discharge status: Home / Self Care | Payer: Medicare Other | Attending: Ophthalmology | Admitting: Ophthalmology

## 2022-05-12 ENCOUNTER — Encounter (HOSPITAL_COMMUNITY): Payer: Self-pay | Admitting: Ophthalmology

## 2022-05-12 ENCOUNTER — Encounter (HOSPITAL_COMMUNITY)
Admission: RE | Disposition: A | Discharge status: Home / Self Care | Payer: Self-pay | Source: Home / Self Care | Attending: Ophthalmology

## 2022-05-12 DIAGNOSIS — H35341 Macular cyst, hole, or pseudohole, right eye: Secondary | ICD-10-CM

## 2022-05-12 DIAGNOSIS — H35342 Macular cyst, hole, or pseudohole, left eye: Secondary | ICD-10-CM | POA: Diagnosis not present

## 2022-05-12 HISTORY — PX: LASER PHOTO ABLATION: SHX5942

## 2022-05-12 HISTORY — PX: SERUM PATCH: SHX6091

## 2022-05-12 HISTORY — PX: MEMBRANE PEEL: SHX5967

## 2022-05-12 HISTORY — PX: GAS INSERTION: SHX5336

## 2022-05-12 HISTORY — PX: 25 GAUGE PARS PLANA VITRECTOMY WITH 20 GAUGE MVR PORT FOR MACULAR HOLE: SHX6096

## 2022-05-12 LAB — CBC
HCT: 39.5 % (ref 36.0–46.0)
Hemoglobin: 13.1 g/dL (ref 12.0–15.0)
MCH: 32.3 pg (ref 26.0–34.0)
MCHC: 33.2 g/dL (ref 30.0–36.0)
MCV: 97.3 fL (ref 80.0–100.0)
Platelets: 226 10*3/uL (ref 150–400)
RBC: 4.06 MIL/uL (ref 3.87–5.11)
RDW: 13.5 % (ref 11.5–15.5)
WBC: 4.6 10*3/uL (ref 4.0–10.5)
nRBC: 0 % (ref 0.0–0.2)

## 2022-05-12 LAB — BASIC METABOLIC PANEL
Anion gap: 9 (ref 5–15)
BUN: 13 mg/dL (ref 8–23)
CO2: 26 mmol/L (ref 22–32)
Calcium: 8.9 mg/dL (ref 8.9–10.3)
Chloride: 104 mmol/L (ref 98–111)
Creatinine, Ser: 0.61 mg/dL (ref 0.44–1.00)
GFR, Estimated: >60 mL/min (ref >60)
Glucose, Bld: 84 mg/dL (ref 70–99)
Potassium: 4 mmol/L (ref 3.5–5.1)
Sodium: 139 mmol/L (ref 135–145)

## 2022-05-12 LAB — AUTOLOGOUS SERUM PATCH PREP

## 2022-05-12 SURGERY — 25 GAUGE PARS PLANA VITRECTOMY WITH 20 GAUGE MVR PORT FOR MACULAR HOLE
Anesthesia: General | Site: Eye | Laterality: Right

## 2022-05-12 MED ORDER — SODIUM HYALURONATE 10 MG/ML IO SOLUTION
PREFILLED_SYRINGE | INTRAOCULAR | Status: DC | PRN
  Administered 2022-05-12: .85 mL via INTRAOCULAR

## 2022-05-12 MED ORDER — BUPIVACAINE HCL (PF) 0.75 % IJ SOLN
INTRAMUSCULAR | Status: DC | PRN
  Administered 2022-05-12: 10 mL

## 2022-05-12 MED ORDER — LIDOCAINE 2% (20 MG/ML) 5 ML SYRINGE
INTRAMUSCULAR | Status: DC | PRN
  Administered 2022-05-12: 60 mg via INTRAVENOUS

## 2022-05-12 MED ORDER — PREDNISOLONE ACETATE 1 % OP SUSP
1.0000 [drp] | Freq: Four times a day (QID) | OPHTHALMIC | Status: DC
  Administered 2022-05-13: 1 [drp] via OPHTHALMIC
  Filled 2022-05-12: qty 5

## 2022-05-12 MED ORDER — TOBRAMYCIN SULFATE 1.2 G IJ SOLR
INTRAMUSCULAR | Status: DC | PRN
  Administered 2022-05-12: 1.2 g

## 2022-05-12 MED ORDER — LIDOCAINE 2% (20 MG/ML) 5 ML SYRINGE
INTRAMUSCULAR | Status: AC
  Filled 2022-05-12: qty 5

## 2022-05-12 MED ORDER — EPHEDRINE SULFATE-NACL 50-0.9 MG/10ML-% IV SOSY
PREFILLED_SYRINGE | INTRAVENOUS | Status: DC | PRN
  Administered 2022-05-12: 5 mg via INTRAVENOUS
  Administered 2022-05-12: 10 mg via INTRAVENOUS

## 2022-05-12 MED ORDER — FLUTICASONE PROPIONATE 50 MCG/ACT NA SUSP: 1.0000 | Freq: Every day | NASAL | Status: DC | PRN

## 2022-05-12 MED ORDER — OXYCODONE HCL 5 MG/5ML PO SOLN: 5.0000 mg | Freq: Once | ORAL | Status: DC | PRN

## 2022-05-12 MED ORDER — ONDANSETRON HCL 4 MG/2ML IJ SOLN: 4.0000 mg | Freq: Once | INTRAMUSCULAR | Status: DC | PRN

## 2022-05-12 MED ORDER — POLYMYXIN B SULFATE 500000 UNITS IJ SOLR
INTRAMUSCULAR | Status: AC
  Filled 2022-05-12: qty 10

## 2022-05-12 MED ORDER — PHENYLEPHRINE 80 MCG/ML (10ML) SYRINGE FOR IV PUSH (FOR BLOOD PRESSURE SUPPORT)
PREFILLED_SYRINGE | INTRAVENOUS | Status: DC | PRN
  Administered 2022-05-12 (×3): 80 ug via INTRAVENOUS

## 2022-05-12 MED ORDER — ROCURONIUM BROMIDE 10 MG/ML (PF) SYRINGE
PREFILLED_SYRINGE | INTRAVENOUS | Status: DC | PRN
  Administered 2022-05-12: 60 mg via INTRAVENOUS
  Administered 2022-05-12: 10 mg via INTRAVENOUS

## 2022-05-12 MED ORDER — ORAL CARE MOUTH RINSE: 15.0000 mL | Freq: Once | OROMUCOSAL | Status: AC

## 2022-05-12 MED ORDER — HYDROCODONE-ACETAMINOPHEN 5-325 MG PO TABS: 1.0000 | ORAL_TABLET | ORAL | Status: DC | PRN

## 2022-05-12 MED ORDER — TOBRAMYCIN SULFATE 1.2 G IJ SOLR
INTRAMUSCULAR | Status: AC
  Filled 2022-05-12: qty 1.2

## 2022-05-12 MED ORDER — ACETAMINOPHEN 325 MG PO TABS
325.0000 mg | ORAL_TABLET | ORAL | Status: DC | PRN
  Administered 2022-05-12 – 2022-05-13 (×2): 650 mg via ORAL
  Filled 2022-05-12 (×2): qty 2

## 2022-05-12 MED ORDER — ONDANSETRON HCL 4 MG/2ML IJ SOLN
INTRAMUSCULAR | Status: AC
  Filled 2022-05-12: qty 2

## 2022-05-12 MED ORDER — SODIUM HYALURONATE 10 MG/ML IO SOLUTION
PREFILLED_SYRINGE | INTRAOCULAR | Status: AC
  Filled 2022-05-12: qty 0.85

## 2022-05-12 MED ORDER — EPHEDRINE 5 MG/ML INJ
INTRAVENOUS | Status: AC
  Filled 2022-05-12: qty 5

## 2022-05-12 MED ORDER — ATORVASTATIN CALCIUM 40 MG PO TABS
40.0000 mg | ORAL_TABLET | Freq: Every evening | ORAL | Status: DC
  Administered 2022-05-12: 40 mg via ORAL
  Filled 2022-05-12: qty 1

## 2022-05-12 MED ORDER — FENTANYL CITRATE (PF) 250 MCG/5ML IJ SOLN
INTRAMUSCULAR | Status: AC
  Filled 2022-05-12: qty 5

## 2022-05-12 MED ORDER — BUPIVACAINE HCL (PF) 0.75 % IJ SOLN
INTRAMUSCULAR | Status: AC
  Filled 2022-05-12: qty 10

## 2022-05-12 MED ORDER — GATIFLOXACIN 0.5 % OP SOLN
1.0000 [drp] | Freq: Four times a day (QID) | OPHTHALMIC | Status: DC
  Filled 2022-05-12: qty 2.5

## 2022-05-12 MED ORDER — SUGAMMADEX SODIUM 200 MG/2ML IV SOLN
INTRAVENOUS | Status: DC | PRN
  Administered 2022-05-12: 200 mg via INTRAVENOUS

## 2022-05-12 MED ORDER — LIDOCAINE HCL 2 % IJ SOLN
INTRAMUSCULAR | Status: AC
  Filled 2022-05-12: qty 20

## 2022-05-12 MED ORDER — FENTANYL CITRATE (PF) 250 MCG/5ML IJ SOLN
INTRAMUSCULAR | Status: DC | PRN
  Administered 2022-05-12: 50 ug via INTRAVENOUS

## 2022-05-12 MED ORDER — AMISULPRIDE (ANTIEMETIC) 5 MG/2ML IV SOLN: 10.0000 mg | Freq: Once | INTRAVENOUS | Status: DC | PRN

## 2022-05-12 MED ORDER — HYDROMORPHONE HCL 1 MG/ML IJ SOLN: 0.2500 mg | INTRAMUSCULAR | Status: DC | PRN

## 2022-05-12 MED ORDER — HYALURONIDASE HUMAN 150 UNIT/ML IJ SOLN
INTRAMUSCULAR | Status: AC
  Filled 2022-05-12: qty 1

## 2022-05-12 MED ORDER — MIDAZOLAM HCL 2 MG/2ML IJ SOLN
INTRAMUSCULAR | Status: DC | PRN
  Administered 2022-05-12: 2 mg via INTRAVENOUS

## 2022-05-12 MED ORDER — BSS PLUS IO SOLN
INTRAOCULAR | Status: AC
  Filled 2022-05-12: qty 500

## 2022-05-12 MED ORDER — BACITRACIN-POLYMYXIN B 500-10000 UNIT/GM OP OINT
TOPICAL_OINTMENT | Freq: Three times a day (TID) | OPHTHALMIC | Status: DC
  Filled 2022-05-12: qty 3.5

## 2022-05-12 MED ORDER — LACTATED RINGERS IV SOLN: INTRAVENOUS | Status: DC

## 2022-05-12 MED ORDER — PROPOFOL 10 MG/ML IV BOLUS
INTRAVENOUS | Status: DC | PRN
  Administered 2022-05-12: 10 mg via INTRAVENOUS
  Administered 2022-05-12: 150 mg via INTRAVENOUS

## 2022-05-12 MED ORDER — ROCURONIUM BROMIDE 10 MG/ML (PF) SYRINGE
PREFILLED_SYRINGE | INTRAVENOUS | Status: AC
  Filled 2022-05-12: qty 20

## 2022-05-12 MED ORDER — SODIUM CHLORIDE 0.45 % IV SOLN: INTRAVENOUS | Status: DC

## 2022-05-12 MED ORDER — BSS IO SOLN
INTRAOCULAR | Status: AC
  Filled 2022-05-12: qty 15

## 2022-05-12 MED ORDER — DEXAMETHASONE SODIUM PHOSPHATE 10 MG/ML IJ SOLN
INTRAMUSCULAR | Status: DC | PRN
  Administered 2022-05-12: 1 mL

## 2022-05-12 MED ORDER — BACITRACIN-POLYMYXIN B 500-10000 UNIT/GM OP OINT
TOPICAL_OINTMENT | OPHTHALMIC | Status: DC | PRN
  Administered 2022-05-12: 1 via OPHTHALMIC

## 2022-05-12 MED ORDER — TROPICAMIDE 1 % OP SOLN
1.0000 [drp] | OPHTHALMIC | Status: AC | PRN
  Administered 2022-05-12 (×3): 1 [drp] via OPHTHALMIC
  Filled 2022-05-12: qty 15

## 2022-05-12 MED ORDER — ATROPINE SULFATE 1 % OP SOLN
OPHTHALMIC | Status: AC
  Filled 2022-05-12: qty 5

## 2022-05-12 MED ORDER — PHENYLEPHRINE HCL-NACL 20-0.9 MG/250ML-% IV SOLN
INTRAVENOUS | Status: DC | PRN
  Administered 2022-05-12: 15 ug/min via INTRAVENOUS

## 2022-05-12 MED ORDER — SODIUM CHLORIDE (PF) 0.9 % IJ SOLN
INTRAMUSCULAR | Status: AC
  Filled 2022-05-12: qty 10

## 2022-05-12 MED ORDER — MIDAZOLAM HCL 2 MG/2ML IJ SOLN
INTRAMUSCULAR | Status: AC
  Filled 2022-05-12: qty 2

## 2022-05-12 MED ORDER — CYCLOPENTOLATE HCL 1 % OP SOLN
1.0000 [drp] | OPHTHALMIC | Status: DC | PRN
  Administered 2022-05-12 (×2): 1 [drp] via OPHTHALMIC

## 2022-05-12 MED ORDER — PHENYLEPHRINE HCL 2.5 % OP SOLN
1.0000 [drp] | OPHTHALMIC | Status: AC | PRN
  Administered 2022-05-12 (×3): 1 [drp] via OPHTHALMIC
  Filled 2022-05-12: qty 2

## 2022-05-12 MED ORDER — MORPHINE SULFATE (PF) 2 MG/ML IV SOLN: 1.0000 mg | INTRAVENOUS | Status: DC | PRN

## 2022-05-12 MED ORDER — MAGNESIUM HYDROXIDE 400 MG/5ML PO SUSP: 15.0000 mL | Freq: Four times a day (QID) | ORAL | Status: DC | PRN

## 2022-05-12 MED ORDER — TEMAZEPAM 7.5 MG PO CAPS: 15.0000 mg | ORAL_CAPSULE | Freq: Every evening | ORAL | Status: DC | PRN

## 2022-05-12 MED ORDER — BACITRACIN-POLYMYXIN B 500-10000 UNIT/GM OP OINT
TOPICAL_OINTMENT | OPHTHALMIC | Status: AC
  Filled 2022-05-12: qty 3.5

## 2022-05-12 MED ORDER — LATANOPROST 0.005 % OP SOLN
1.0000 [drp] | Freq: Every day | OPHTHALMIC | Status: DC
  Filled 2022-05-12: qty 2.5

## 2022-05-12 MED ORDER — DORZOLAMIDE HCL-TIMOLOL MAL 2-0.5 % OP SOLN
1.0000 [drp] | Freq: Once | OPHTHALMIC | Status: AC
  Administered 2022-05-12: 1 [drp] via OPHTHALMIC

## 2022-05-12 MED ORDER — STERILE WATER FOR INJECTION IJ SOLN
INTRAMUSCULAR | Status: AC
  Filled 2022-05-12: qty 30

## 2022-05-12 MED ORDER — DEXAMETHASONE SODIUM PHOSPHATE 10 MG/ML IJ SOLN
INTRAMUSCULAR | Status: AC
  Filled 2022-05-12: qty 1

## 2022-05-12 MED ORDER — PANTOPRAZOLE SODIUM 40 MG PO TBEC
40.0000 mg | DELAYED_RELEASE_TABLET | Freq: Every day | ORAL | Status: DC
  Administered 2022-05-13: 40 mg via ORAL
  Filled 2022-05-12: qty 1

## 2022-05-12 MED ORDER — EPINEPHRINE PF 1 MG/ML IJ SOLN
INTRAMUSCULAR | Status: AC
  Filled 2022-05-12: qty 1

## 2022-05-12 MED ORDER — OXYCODONE HCL 5 MG PO TABS: 5.0000 mg | ORAL_TABLET | Freq: Once | ORAL | Status: DC | PRN

## 2022-05-12 MED ORDER — CEFTAZIDIME 1 G IJ SOLR
INTRAMUSCULAR | Status: AC
  Filled 2022-05-12: qty 1

## 2022-05-12 MED ORDER — EPINEPHRINE PF 1 MG/ML IJ SOLN
INTRAOCULAR | Status: DC | PRN
  Administered 2022-05-12: 500 mL

## 2022-05-12 MED ORDER — TRIAMCINOLONE ACETONIDE 40 MG/ML IJ SUSP
INTRAMUSCULAR | Status: AC
  Filled 2022-05-12: qty 5

## 2022-05-12 MED ORDER — BRIMONIDINE TARTRATE 0.2 % OP SOLN
1.0000 [drp] | Freq: Two times a day (BID) | OPHTHALMIC | Status: DC
  Filled 2022-05-12: qty 5

## 2022-05-12 MED ORDER — CHLORHEXIDINE GLUCONATE 0.12 % MT SOLN
15.0000 mL | Freq: Once | OROMUCOSAL | Status: AC
  Administered 2022-05-12: 15 mL via OROMUCOSAL
  Filled 2022-05-12: qty 15

## 2022-05-12 MED ORDER — ACETAZOLAMIDE SODIUM 500 MG IJ SOLR
500.0000 mg | Freq: Once | INTRAMUSCULAR | Status: AC
  Administered 2022-05-13: 500 mg via INTRAVENOUS
  Filled 2022-05-12: qty 500

## 2022-05-12 MED ORDER — PHENYLEPHRINE 80 MCG/ML (10ML) SYRINGE FOR IV PUSH (FOR BLOOD PRESSURE SUPPORT)
PREFILLED_SYRINGE | INTRAVENOUS | Status: AC
  Filled 2022-05-12: qty 10

## 2022-05-12 MED ORDER — TETRACAINE HCL 0.5 % OP SOLN
2.0000 [drp] | Freq: Once | OPHTHALMIC | Status: DC
  Filled 2022-05-12: qty 4

## 2022-05-12 MED ORDER — ACETAMINOPHEN 500 MG PO TABS
1000.0000 mg | ORAL_TABLET | Freq: Once | ORAL | Status: AC
  Administered 2022-05-12: 1000 mg via ORAL
  Filled 2022-05-12: qty 2

## 2022-05-12 MED ORDER — GATIFLOXACIN 0.5 % OP SOLN
1.0000 [drp] | OPHTHALMIC | Status: AC | PRN
  Administered 2022-05-12 (×3): 1 [drp] via OPHTHALMIC
  Filled 2022-05-12: qty 2.5

## 2022-05-12 MED ORDER — ONDANSETRON HCL 4 MG/2ML IJ SOLN
INTRAMUSCULAR | Status: DC | PRN
  Administered 2022-05-12: 4 mg via INTRAVENOUS

## 2022-05-12 MED ORDER — DORZOLAMIDE HCL 2 % OP SOLN
1.0000 [drp] | Freq: Three times a day (TID) | OPHTHALMIC | Status: DC
  Filled 2022-05-12: qty 10

## 2022-05-12 MED ORDER — CLINDAMYCIN PHOSPHATE 600 MG/50ML IV SOLN
600.0000 mg | Freq: Once | INTRAVENOUS | Status: AC
Start: 2022-05-12 — End: 2022-05-12
  Administered 2022-05-12: 600 mg via INTRAVENOUS
  Filled 2022-05-12: qty 50

## 2022-05-12 MED ORDER — STERILE WATER FOR INJECTION IJ SOLN
INTRAMUSCULAR | Status: AC
  Filled 2022-05-12: qty 20

## 2022-05-12 MED ORDER — ONDANSETRON HCL 4 MG/2ML IJ SOLN: 4.0000 mg | Freq: Four times a day (QID) | INTRAMUSCULAR | Status: DC | PRN

## 2022-05-12 SURGICAL SUPPLY — 68 items
BAG COUNTER SPONGE SURGICOUNT (BAG) ×2 IMPLANT
BAG SPNG CNTER NS LX DISP (BAG) ×2
BALL CTTN LRG ABS STRL LF (GAUZE/BANDAGES/DRESSINGS) ×6
BAND WRIST GAS GREEN (MISCELLANEOUS) IMPLANT
BLADE EYE CATARACT 19 1.4 BEAV (BLADE) IMPLANT
BLADE MVR KNIFE 19G (BLADE) IMPLANT
BLADE MVR KNIFE 20G (BLADE) ×2 IMPLANT
CANNULA VLV SOFT TIP 25G (OPHTHALMIC) ×2 IMPLANT
CANNULA VLV SOFT TIP 25GA (OPHTHALMIC) ×2 IMPLANT
CORD BIPOLAR FORCEPS 12FT (ELECTRODE) ×2 IMPLANT
COTTONBALL LRG STERILE PKG (GAUZE/BANDAGES/DRESSINGS) ×6 IMPLANT
COVER MAYO STAND STRL (DRAPES) IMPLANT
DRAPE INCISE 51X51 W/FILM STRL (DRAPES) IMPLANT
DRAPE OPHTHALMIC 77X100 STRL (CUSTOM PROCEDURE TRAY) ×2 IMPLANT
ERASER HMR WETFIELD 23G BP (MISCELLANEOUS) ×2 IMPLANT
FILTER BLUE MILLIPORE (MISCELLANEOUS) IMPLANT
FILTER STRAW FLUID ASPIR (MISCELLANEOUS) ×2 IMPLANT
FORCEPS GRIESHABER ILM 25G A (INSTRUMENTS) IMPLANT
GAS AUTO FILL CONSTEL (OPHTHALMIC) ×2
GAS AUTO FILL CONSTELLATION (OPHTHALMIC) ×2 IMPLANT
GAS WRIST BAND GREEN (MISCELLANEOUS)
GLOVE ECLIPSE 8.5 STRL (GLOVE) ×2 IMPLANT
GLOVE SS BIOGEL STRL SZ 6.5 (GLOVE) ×2 IMPLANT
GLOVE SS BIOGEL STRL SZ 7 (GLOVE) ×2 IMPLANT
GLOVE TRIUMPH SURG SIZE 8.5 (KITS) ×2 IMPLANT
GOWN STRL REUS W/ TWL LRG LVL3 (GOWN DISPOSABLE) ×6 IMPLANT
GOWN STRL REUS W/TWL LRG LVL3 (GOWN DISPOSABLE) ×6
HANDLE PNEUMATIC FOR CONSTEL (OPHTHALMIC) IMPLANT
KIT BASIN OR (CUSTOM PROCEDURE TRAY) ×2 IMPLANT
KNIFE GRIESHABER SHARP 2.5MM (MISCELLANEOUS) IMPLANT
MICROPICK 25G (MISCELLANEOUS)
NDL 18GX1X1/2 (RX/OR ONLY) (NEEDLE) ×2 IMPLANT
NDL 25GX 5/8IN NON SAFETY (NEEDLE) ×2 IMPLANT
NDL FILTER BLUNT 18X1 1/2 (NEEDLE) IMPLANT
NDL HYPO 30X.5 LL (NEEDLE) IMPLANT
NEEDLE 18GX1X1/2 (RX/OR ONLY) (NEEDLE) ×2 IMPLANT
NEEDLE 25GX 5/8IN NON SAFETY (NEEDLE) ×2 IMPLANT
NEEDLE FILTER BLUNT 18X1 1/2 (NEEDLE) IMPLANT
NEEDLE HYPO 30X.5 LL (NEEDLE) IMPLANT
NS IRRIG 1000ML POUR BTL (IV SOLUTION) ×2 IMPLANT
PACK FRAGMATOME (OPHTHALMIC) IMPLANT
PACK VITRECTOMY CUSTOM (CUSTOM PROCEDURE TRAY) ×2 IMPLANT
PAD ARMBOARD 7.5X6 YLW CONV (MISCELLANEOUS) ×4 IMPLANT
PAK PIK VITRECTOMY CVS 25GA (OPHTHALMIC) ×2 IMPLANT
PIC ILLUMINATED 25G (OPHTHALMIC) ×2
PICK MICROPICK 25G (MISCELLANEOUS) IMPLANT
PIK ILLUMINATED 25G (OPHTHALMIC) ×2 IMPLANT
PROBE LASER ILLUM FLEX CVD 25G (OPHTHALMIC) IMPLANT
REPL STRA BRUSH NDL (NEEDLE) ×2 IMPLANT
REPL STRA BRUSH NEEDLE (NEEDLE) ×2 IMPLANT
RESERVOIR BACK FLUSH (MISCELLANEOUS) ×2 IMPLANT
ROLLS DENTAL (MISCELLANEOUS) ×4 IMPLANT
SCRAPER DIAMOND 25GA (OPHTHALMIC RELATED) IMPLANT
SCRAPER DIAMOND DUST MEMBRANE (MISCELLANEOUS) ×2 IMPLANT
SPONGE SURGIFOAM ABS GEL 12-7 (HEMOSTASIS) ×2 IMPLANT
STOPCOCK 4 WAY LG BORE MALE ST (IV SETS) IMPLANT
SUT CHROMIC 7 0 TG140 8 (SUTURE) IMPLANT
SUT ETHILON 9 0 TG140 8 (SUTURE) ×2 IMPLANT
SUT POLY NON ABSORB 10-0 8 STR (SUTURE) IMPLANT
SUT SILK 4 0 RB 1 (SUTURE) IMPLANT
SYR 10ML LL (SYRINGE) IMPLANT
SYR 20ML LL LF (SYRINGE) ×2 IMPLANT
SYR 5ML LL (SYRINGE) IMPLANT
SYR BULB EAR ULCER 3OZ GRN STR (SYRINGE) ×2 IMPLANT
SYR TB 1ML LUER SLIP (SYRINGE) ×2 IMPLANT
TUBING HIGH PRESS EXTEN 6IN (TUBING) IMPLANT
WATER STERILE IRR 1000ML POUR (IV SOLUTION) ×2 IMPLANT
WIPE INSTRUMENT VISIWIPE 73X73 (MISCELLANEOUS) IMPLANT

## 2022-05-12 NOTE — Progress Notes (Signed)
Patient arrived to [place] room 7 alert and oriented. Face down per post procedure protocol. Pain level 0/10. Bed in lowest position. Call light in reach. Will continue to monitor pt.

## 2022-05-12 NOTE — H&P (Signed)
I examined the patient today and there is no change in the medical status 

## 2022-05-12 NOTE — Anesthesia Procedure Notes (Signed)
Procedure Name: Intubation Date/Time: 05/12/2022 11:55 AM  Performed by: Dorthea Cove, CRNAPre-anesthesia Checklist: Patient identified, Emergency Drugs available, Suction available and Patient being monitored Patient Re-evaluated:Patient Re-evaluated prior to induction Oxygen Delivery Method: Circle system utilized Preoxygenation: Pre-oxygenation with 100% oxygen Induction Type: IV induction Ventilation: Mask ventilation without difficulty Laryngoscope Size: Mac and 3 Grade View: Grade I Tube type: Oral Tube size: 7.0 mm Number of attempts: 1 Airway Equipment and Method: Stylet and Oral airway Placement Confirmation: ETT inserted through vocal cords under direct vision, positive ETCO2 and breath sounds checked- equal and bilateral Secured at: 21 cm Tube secured with: Tape Dental Injury: Teeth and Oropharynx as per pre-operative assessment

## 2022-05-12 NOTE — Anesthesia Postprocedure Evaluation (Signed)
Anesthesia Post Note  Patient: Emily Richard  Procedure(s) Performed: 25 GAUGE PARS PLANA VITRECTOMY WITH 20 GAUGE MVR PORT FOR MACULAR HOLE (Right) LASER PHOTO ABLATION (Right: Eye) INSERTION OF GAS (Right: Eye) MEMBRANE PEEL (Right: Eye) SERUM PATCH (Right: Eye)     Patient location during evaluation: PACU Anesthesia Type: General Level of consciousness: awake and alert, oriented and patient cooperative Pain management: pain level controlled Vital Signs Assessment: post-procedure vital signs reviewed and stable Respiratory status: spontaneous breathing, nonlabored ventilation and respiratory function stable Cardiovascular status: blood pressure returned to baseline and stable Postop Assessment: no apparent nausea or vomiting Anesthetic complications: no   No notable events documented.  Last Vitals:  Vitals:   05/12/22 1330 05/12/22 1345  BP: 119/66 119/62  Pulse: 78 70  Resp: 16 18  Temp:    SpO2: 91% 97%    Last Pain:  Vitals:   05/12/22 1324  TempSrc:   PainSc: 0-No pain                 Pervis Hocking

## 2022-05-12 NOTE — Transfer of Care (Signed)
Immediate Anesthesia Transfer of Care Note  Patient: Emily Richard  Procedure(s) Performed: 25 GAUGE PARS PLANA VITRECTOMY WITH 20 GAUGE MVR PORT FOR MACULAR HOLE (Right) LASER PHOTO ABLATION (Right: Eye) INSERTION OF GAS (Right: Eye) MEMBRANE PEEL (Right: Eye) SERUM PATCH (Right: Eye)  Patient Location: PACU  Anesthesia Type:General  Level of Consciousness: awake and drowsy  Airway & Oxygen Therapy: Patient Spontanous Breathing and Patient connected to face mask oxygen  Post-op Assessment: Report given to RN and Post -op Vital signs reviewed and stable  Post vital signs: Reviewed and stable  Last Vitals:  Vitals Value Taken Time  BP 124/64 05/12/22 1324  Temp    Pulse 80 05/12/22 1325  Resp 10 05/12/22 1325  SpO2 98 % 05/12/22 1325  Vitals shown include unvalidated device data.  Last Pain:  Vitals:   05/12/22 0958  TempSrc:   PainSc: 0-No pain         Complications: No notable events documented.

## 2022-05-12 NOTE — Brief Op Note (Signed)
05/12/2022  1:25 PM  PATIENT:  Emily Richard  72 y.o. female  PRE-OPERATIVE DIAGNOSIS:  macular hole right eye  POST-OPERATIVE DIAGNOSIS:  macular hole right eye  PROCEDURE:  Procedure(s): 25 GAUGE PARS PLANA VITRECTOMY WITH 20 GAUGE MVR PORT FOR MACULAR HOLE (Right) LASER PHOTO ABLATION (Right) INSERTION OF GAS (Right) MEMBRANE PEEL (Right) SERUM PATCH (Right)  SURGEON:  Surgeon(s) and Role:    * Hayden Pedro, MD - Primary  Brief Operative note   Preoperative diagnosis:  macular hole right eye Postoperative diagnosis  * No Diagnosis Codes entered *  Procedures: see above  Surgeon:  Hayden Pedro, MD...  Assistant:  Deatra Ina SA    Anesthesia: General  Specimen: none  Estimated blood loss:  1cc  Complications: none  Patient sent to PACU in good condition  Composed by Hayden Pedro MD  Dictation number: [id number]

## 2022-05-12 NOTE — Op Note (Unsigned)
NAME: Emily Richard, Emily Richard MEDICAL RECORD NO: [id number] ACCOUNT NO: [id number] DATE OF BIRTH: 20-May-1950 FACILITY: [facility] LOCATION: [facility] PHYSICIAN: Chrystie Nose. Zigmund Daniel, MD  Operative Report   DATE OF PROCEDURE: 05/12/2022  ADMISSION DIAGNOSIS:  Macular hole, right eye.  PROCEDURE:  Repair of macular hole with pars plana vitrectomy, retinal photocoagulation, internal limiting membrane peel, gas fluid exchange, serum patch all in the right eye.  SURGEON:  Chrystie Nose. Zigmund Daniel, MD.  ASSISTANT:  Deatra Ina, SA.  ANESTHESIA:  General.  DESCRIPTION OF PROCEDURE:  Usual prep and drape, 3 layered scleral incision at 2 o'clock.  After a conjunctival peritomy at 2 o'clock.  The diamond knife was used to make a frown incision in the sclera and then MVR incision into the vitreous cavity.  A  25 gauge trocars placed at the 8 and 10 o'clock, infusion at 8 o'clock.  The indirect ophthalmoscope laser was moved into place, 707 burns were placed around the retinal periphery.  The power was 400 milliwatts, 1000 microns each and 0.1 seconds each.   Contact lens ring was anchored into place at 6 and 12 o'clock with Vicryl suture.  Provisc was placed on the corneal surface and the flat contact lens was placed.  Pars plana vitrectomy was begun in a core fashion.  Dense vitreous material was seen in  the mid vitreous.  This was carefully removed under low suction and rapid cutting down to the macular surface.  Inspection of the macular surface showed a large stage IV macular hole with elevated edges, silicone tipped suction line was drawn down  towards the macular region until the fish strike sign occurred.  The posterior hyaloid was elevated with the suction line and with the lighted pick.  Several layers of posterior hyaloid were lifted and removed.  The diamond dusted membrane scraper was  used to remove the internal limiting membrane for 360 degrees around the macular hole approximately 1 disk diameter of retina was  treated with this method.  The edges of the macular hole were pushed toward each other.  They did not completely touch, but  the edges of the macular hole became very mobile.  The contact lens was replaced with 30 degree prismatic lens.  The vitrectomy was continued into the mid periphery for 360 degrees, all surface hyaloid was then removed.  The vitrectomy was continued into  the far periphery and extreme periphery where additional vitreous was encountered and removed with low suction and rapid cutting.  Once this was accomplished, the magnifying contact lens was placed onto a layer of methyl cellulose and onto the corneal  surface.  The magnified view of the macula and the macular hole was seen.  The diamond dusted membrane scraper again was used to tease the internal limiting membrane away from the retinal surface especially at the edges of the hole so that the edges of  the hole were mobile.  A gas fluid exchange was then carried out with room air.  The entire vitreous cavity was cleared of fluid.  Sufficient time was allowed for additional fluid to collect in the posterior segment.  During this time C3F8 concentration  of 14% was mixed into a 50 mL syringe and the serum patch was prepared. Silicone tipped, suction line was drawn down toward the disk and all posterior fluid was removed, the serum patch was delivered.  Excess serum was removed.  The C3F8 was exchanged  for intravitreal gas.  The scleral opening at 2 o'clock was closed with a  single 9-0 nylon suture.  25 gauge trocars were removed.  The wounds were tested and found to be secure.  The conjunctiva was closed with wet field cautery.  Polymyxin and  tobramycin were rinsed around the globe for antibiotic coverage.  Decadron 10 mg was injected into the lower subconjunctival space.  Atropine solution was applied.  Marcaine was injected around the globe for postoperative pain.  Closing pressure was 10  with a Barraquer tonometer.  Polysporin  ophthalmic ointment a patch and shield were placed.  The patient was awakened and taken to recovery.  DURATION:  1 hour 15 minutes.  COMPLICATIONS: None.   PUS D: 05/12/2022 1:16:28 pm T: 05/12/2022 1:46:00 pm  JOB: [id number]/ [id number]

## 2022-05-13 ENCOUNTER — Encounter (HOSPITAL_COMMUNITY): Payer: Self-pay | Admitting: Ophthalmology

## 2022-05-13 DIAGNOSIS — H35341 Macular cyst, hole, or pseudohole, right eye: Secondary | ICD-10-CM | POA: Diagnosis not present

## 2022-05-13 MED ORDER — GATIFLOXACIN 0.5 % OP SOLN: 1.0000 [drp] | Freq: Four times a day (QID) | OPHTHALMIC | Status: DC

## 2022-05-13 MED ORDER — BACITRACIN-POLYMYXIN B 500-10000 UNIT/GM OP OINT: TOPICAL_OINTMENT | Freq: Three times a day (TID) | OPHTHALMIC | 0 refills | Status: DC

## 2022-05-13 MED ORDER — STERILE WATER FOR INJECTION IJ SOLN
INTRAMUSCULAR | Status: AC
  Administered 2022-05-13: 10 mL
  Filled 2022-05-13: qty 10

## 2022-05-13 MED ORDER — PREDNISOLONE ACETATE 1 % OP SUSP: 1.0000 [drp] | Freq: Four times a day (QID) | OPHTHALMIC | 0 refills | Status: DC

## 2022-05-13 NOTE — Progress Notes (Signed)
05/13/2022, 6:40 AM  Mental Status:  Awake, Alert, Oriented  Anterior segment: Cornea  Clear    Anterior Chamber Clear    Lens:   Cataract  Intra Ocular Pressure 13 mmHg with Tonopen  Vitreous: Clear 90%gas bubble   Retina:  Attached Good laser reaction   Impression: Excellent result Retina attached   Final Diagnosis: Principal Problem:   Macular hole, right eye   Plan: start post operative eye drops.  Discharge to home.  Give post operative instructions  Hayden Pedro 05/13/2022, 6:40 AM

## 2022-05-14 ENCOUNTER — Encounter (INDEPENDENT_AMBULATORY_CARE_PROVIDER_SITE_OTHER): Payer: Medicare Other | Admitting: Ophthalmology

## 2022-05-19 ENCOUNTER — Encounter (INDEPENDENT_AMBULATORY_CARE_PROVIDER_SITE_OTHER): Payer: Medicare Other | Admitting: Ophthalmology

## 2022-05-19 DIAGNOSIS — H35341 Macular cyst, hole, or pseudohole, right eye: Secondary | ICD-10-CM

## 2022-06-02 ENCOUNTER — Other Ambulatory Visit: Payer: Self-pay | Admitting: Family Medicine

## 2022-06-02 DIAGNOSIS — Z1231 Encounter for screening mammogram for malignant neoplasm of breast: Secondary | ICD-10-CM

## 2022-06-09 ENCOUNTER — Encounter (INDEPENDENT_AMBULATORY_CARE_PROVIDER_SITE_OTHER): Payer: Medicare Other | Admitting: Ophthalmology

## 2022-06-09 DIAGNOSIS — H35341 Macular cyst, hole, or pseudohole, right eye: Secondary | ICD-10-CM

## 2022-07-06 ENCOUNTER — Ambulatory Visit
Admission: RE | Admit: 2022-07-06 | Discharge: 2022-07-06 | Disposition: A | Discharge status: Home / Self Care | Payer: Medicare Other | Source: Ambulatory Visit | Attending: Family Medicine | Admitting: Family Medicine

## 2022-07-06 DIAGNOSIS — Z1231 Encounter for screening mammogram for malignant neoplasm of breast: Secondary | ICD-10-CM | POA: Diagnosis present

## 2022-07-23 ENCOUNTER — Encounter (INDEPENDENT_AMBULATORY_CARE_PROVIDER_SITE_OTHER): Payer: Medicare Other | Admitting: Ophthalmology

## 2022-07-23 DIAGNOSIS — H35341 Macular cyst, hole, or pseudohole, right eye: Secondary | ICD-10-CM

## 2022-08-18 ENCOUNTER — Encounter (INDEPENDENT_AMBULATORY_CARE_PROVIDER_SITE_OTHER): Payer: Medicare Other | Admitting: Ophthalmology

## 2022-08-18 DIAGNOSIS — H43813 Vitreous degeneration, bilateral: Secondary | ICD-10-CM

## 2022-08-18 DIAGNOSIS — H353132 Nonexudative age-related macular degeneration, bilateral, intermediate dry stage: Secondary | ICD-10-CM | POA: Diagnosis not present

## 2022-09-02 ENCOUNTER — Other Ambulatory Visit: Payer: Self-pay | Admitting: Family Medicine

## 2022-09-02 DIAGNOSIS — N200 Calculus of kidney: Secondary | ICD-10-CM

## 2022-09-02 NOTE — Progress Notes (Unsigned)
09/03/22 9:51 AM   Emily Richard 04/16/1951 [id number]  Referring provider:  Jerl Mina, MD [address] [place],  [place] [address]  Urological history: 1. High risk hematuria -non-smoker -CTU 2021 Mild right hydronephrosis and hydroureter extending down to a 4 by 2 by 2 mm in long axis right UVJ calculus.  Nonobstructive 0.8 cm in long axis left kidney lower pole renal calculus -cystoscopy 2021 NED -RUS 2022 no worrisome findings   2. Nephrolithiasis -stone composition 80% calcium oxalate dihydrate, 10% calcium oxalate monohydrate and 10% and 10% hydroxyapatite -06/2020 for left URS   3. rUTI's -contributing factors of age and vaginal atrophy -documented urine cultures over the last year             October 29, 2021 for E. coli   HPI: Emily Richard is a 72 y.o.female who presents today for yearly follow up.   She is having 1-7 daytime voids, nocturia x 1-2 with a stress urinary continence.  She leaks 1-2 times a week.  She wears 1 panty liner and 1 depends daily.  She engages in toilet mapping.  Patient denies any modifying or aggravating factors.  Patient denies any gross hematuria, dysuria or suprapubic/flank pain.  Patient denies any fevers, chills, nausea or vomiting.    UA yellow clear, specific gravity 1.020, pH 6.5, 1+ protein, trace leukocyte, 6-10 WBCs, 0-2 RBCs, 0-10 epithelial cells, granular casts present, mucus threads present and moderate bacteria  KUB 7 mm calcific density in the vicinity of the midpole of the right kidney.  Stable right sided pelvic phleboliths.    PMH: Past Medical History:  Diagnosis Date   Arthritis    Bursitis    Family history of breast cancer    mother, aunt   GERD (gastroesophageal reflux disease)    History of hiatal hernia    History of kidney stones    Hyperlipidemia    Osteoporosis    Poor circulation    Schwannoma    Shortness of breath     Surgical History: Past Surgical History:   Procedure Laterality Date   25 GAUGE PARS PLANA VITRECTOMY WITH 20 GAUGE MVR PORT FOR MACULAR HOLE Right 05/12/2022   Procedure: 25 GAUGE PARS PLANA VITRECTOMY WITH 20 GAUGE MVR PORT FOR MACULAR HOLE;  Surgeon: Sherrie George, MD;  Location: [facility] OR;  Service: Ophthalmology;  Laterality: Right;   BREAST EXCISIONAL BIOPSY Right 2003   NEG   COLONOSCOPY  01/2014   CYSTOSCOPY/URETEROSCOPY/HOLMIUM LASER/STENT PLACEMENT Left 06/24/2020   Procedure: CYSTOSCOPY/URETEROSCOPY/HOLMIUM LASER/STENT PLACEMENT;  Surgeon: Vanna Scotland, MD;  Location: [facility] ORS;  Service: Urology;  Laterality: Left;   GAS INSERTION Right 05/12/2022   Procedure: INSERTION OF GAS;  Surgeon: Sherrie George, MD;  Location: [facility] OR;  Service: Ophthalmology;  Laterality: Right;   LASER PHOTO ABLATION Right 05/12/2022   Procedure: LASER PHOTO ABLATION;  Surgeon: Sherrie George, MD;  Location: [facility] OR;  Service: Ophthalmology;  Laterality: Right;   MEMBRANE PEEL Right 05/12/2022   Procedure: MEMBRANE PEEL;  Surgeon: Sherrie George, MD;  Location: [facility] OR;  Service: Ophthalmology;  Laterality: Right;   SERUM PATCH Right 05/12/2022   Procedure: SERUM PATCH;  Surgeon: Sherrie George, MD;  Location: [facility] OR;  Service: Ophthalmology;  Laterality: Right;   tumor biopsy  09/2010   VEIN SURGERY  04/2020   DONE IN OFFICE   VULVA Ples Specter BIOPSY      Home Medications:  Allergies as of 09/03/2022  Reactions   Penicillins Hives, Itching, Rash   All over body.        Medication List        Accurate as of Sep 03, 2022  9:51 AM. If you have any questions, ask your nurse or doctor.          acetaminophen 500 MG tablet Commonly known as: TYLENOL Take 1,000 mg by mouth every 6 (six) hours as needed for moderate pain (pain).   aspirin EC 81 MG tablet Take 81 mg by mouth daily. Swallow whole.   atorvastatin 40 MG tablet Commonly known as: LIPITOR Take 40 mg by mouth every evening.   AZO CRANBERRY PO Take 2 tablets by  mouth daily.   bacitracin-polymyxin b ophthalmic ointment Commonly known as: POLYSPORIN Place into the right eye 3 (three) times daily. apply to eye every 12 hours while awake   cetirizine 10 MG tablet Commonly known as: ZYRTEC Take 10 mg by mouth daily as needed for allergies.   Cholecalciferol 25 MCG (1000 UT) tablet Take 1,000 Units by mouth daily.   denosumab 60 MG/ML Soln injection Commonly known as: PROLIA Inject 60 mg into the skin every 6 (six) months.   fluticasone 50 MCG/ACT nasal spray Commonly known as: FLONASE Place 1 spray into both nostrils daily as needed for allergies.   gatifloxacin 0.5 % Soln Commonly known as: ZYMAXID Place 1 drop into the right eye 4 (four) times daily.   halobetasol 0.05 % ointment Commonly known as: ULTRAVATE AAA 1-2 times weekly as maintenance What changed:  how much to take how to take this when to take this reasons to take this additional instructions   multivitamin with minerals Tabs tablet Take 1 tablet by mouth daily. One A Day Women's 50+   omeprazole 20 MG capsule Commonly known as: PRILOSEC Take 20 mg by mouth every morning.   prednisoLONE acetate 1 % ophthalmic suspension Commonly known as: PRED FORTE Place 1 drop into the right eye 4 (four) times daily.        Allergies:  Allergies  Allergen Reactions   Penicillins Hives, Itching and Rash    All over body.     Family History: Family History  Problem Relation Age of Onset   Other Mother        left mastectomy 1998   Osteoporosis Mother    Breast cancer Mother 37   Uterine cancer Mother 46   Breast cancer Maternal Aunt 32    Social History:  reports that she has never smoked. She has never used smokeless tobacco. She reports that she does not drink alcohol and does not use drugs.   Physical Exam: BP 108/71   Pulse 71   Ht 5\' 6"  (1.676 m)   Wt 165 lb (74.8 kg)   BMI 26.63 kg/m   Constitutional:  Well nourished. Alert and oriented, No acute  distress. HEENT: [place] AT, moist mucus membranes.  Trachea midline Cardiovascular: No clubbing, cyanosis, or edema. Respiratory: Normal respiratory effort, no increased work of breathing. Neurologic: Grossly intact, no focal deficits, moving all 4 extremities. Psychiatric: Normal mood and affect.    Laboratory Data: Serum creatinine 0.61 with a GFR of greater than 60 on May 12, 2022 Urinalysis  See EPIC and HPI  I have reviewed the labs.    Pertinent Imaging: KUB stable right pelvic phleboliths.  Calcification in the vicinity of the right midpole kidney stable. I have independently reviewed the films.  See HPI.  Radiologist interpretation still pending  Assessment & Plan:    1. High risk hematuria -non smoker -work up 2021 - positive for nephrolithiasis -no reports of gross heme -UA negative for microscopic hematuria  2. Nephrolithiasis -KUB stable calcification in the midpole of the right kidney -She has not had any renal colic, gross hematuria or passage of fragments -Will continue to monitor with yearly KUB  Return in about 1 year (around 09/03/2023) for KUB, UA and OAB.  Cloretta Ned   [facility] [address], [address] [place], [place] [address] [phone]

## 2022-09-03 ENCOUNTER — Ambulatory Visit
Admission: RE | Admit: 2022-09-03 | Discharge: 2022-09-03 | Disposition: A | Discharge status: Home / Self Care | Payer: Medicare Other | Source: Ambulatory Visit | Attending: Urology | Admitting: Urology

## 2022-09-03 ENCOUNTER — Encounter: Payer: Self-pay | Admitting: Urology

## 2022-09-03 ENCOUNTER — Ambulatory Visit (INDEPENDENT_AMBULATORY_CARE_PROVIDER_SITE_OTHER): Payer: Medicare Other | Admitting: Urology

## 2022-09-03 VITALS — BP 108/71 | HR 71 | Ht 66.0 in | Wt 165.0 lb

## 2022-09-03 DIAGNOSIS — N200 Calculus of kidney: Secondary | ICD-10-CM | POA: Diagnosis present

## 2022-09-03 DIAGNOSIS — R319 Hematuria, unspecified: Secondary | ICD-10-CM

## 2022-09-03 LAB — URINALYSIS, COMPLETE
Bilirubin, UA: NEGATIVE
Glucose, UA: NEGATIVE
Ketones, UA: NEGATIVE
Nitrite, UA: NEGATIVE
RBC, UA: NEGATIVE
Specific Gravity, UA: 1.02 (ref 1.005–1.030)
Urobilinogen, Ur: 0.2 mg/dL (ref 0.2–1.0)
pH, UA: 6.5 (ref 5.0–7.5)

## 2022-09-03 LAB — MICROSCOPIC EXAMINATION

## 2022-09-15 ENCOUNTER — Other Ambulatory Visit: Payer: Self-pay | Admitting: Family Medicine

## 2022-09-15 DIAGNOSIS — N200 Calculus of kidney: Secondary | ICD-10-CM

## 2022-09-15 NOTE — Progress Notes (Unsigned)
09/16/22 3:54 PM   Emily Richard Nov 01, 1950 [id number]  Referring provider:  Jerl Mina, MD [address] [place],  [place] [address]  Urological history: 1. High risk hematuria -non-smoker -CTU 2021 Mild right hydronephrosis and hydroureter extending down to a 4 by 2 by 2 mm in long axis right UVJ calculus.  Nonobstructive 0.8 cm in long axis left kidney lower pole renal calculus -cystoscopy 2021 NED -RUS 2022 no worrisome findings   2. Nephrolithiasis -stone composition 80% calcium oxalate dihydrate, 10% calcium oxalate monohydrate and 10% and 10% hydroxyapatite -06/2020 for left URS   3. rUTI's -contributing factors of age and vaginal atrophy -documented urine cultures over the last year             October 29, 2021 for E. coli   HPI: Emily Richard is a 72 y.o.female who presents today for flank pain and gross hematuria.    On Monday, she started experiencing an upset stomach.  She then had episodes of gross hematuria and passage of clots.   She did not have gross heme this am.  She is having bilateral lower back pain, but it is slightly worse on the right.   She is having some nausea and some chills last night.    Patient denies any modifying or aggravating factors.  Patient denies any dysuria or flank pain.  Patient denies any fevers or vomiting.    UA yellow clear, specific gravity > 1.030, pH 6.0, 2+ protein, trace leuks, 11-30 WBC's, 0-2 RBC's, 0-10 epithelial cells, hyaline casts present, mucus threads present and moderate bacteria.    KUB no definitive calculi identified.  STAT non-contrast CT with punctate bilateral nephrolithiasis without any evidence of obstructive uropathy  PMH: Past Medical History:  Diagnosis Date   Arthritis    Bursitis    Family history of breast cancer    mother, aunt   GERD (gastroesophageal reflux disease)    History of hiatal hernia    History of kidney stones    Hyperlipidemia    Osteoporosis    Poor  circulation    Schwannoma    Shortness of breath     Surgical History: Past Surgical History:  Procedure Laterality Date   25 GAUGE PARS PLANA VITRECTOMY WITH 20 GAUGE MVR PORT FOR MACULAR HOLE Right 05/12/2022   Procedure: 25 GAUGE PARS PLANA VITRECTOMY WITH 20 GAUGE MVR PORT FOR MACULAR HOLE;  Surgeon: Sherrie George, MD;  Location: [facility] OR;  Service: Ophthalmology;  Laterality: Right;   BREAST EXCISIONAL BIOPSY Right 2003   NEG   COLONOSCOPY  01/2014   CYSTOSCOPY/URETEROSCOPY/HOLMIUM LASER/STENT PLACEMENT Left 06/24/2020   Procedure: CYSTOSCOPY/URETEROSCOPY/HOLMIUM LASER/STENT PLACEMENT;  Surgeon: Vanna Scotland, MD;  Location: [facility] ORS;  Service: Urology;  Laterality: Left;   GAS INSERTION Right 05/12/2022   Procedure: INSERTION OF GAS;  Surgeon: Sherrie George, MD;  Location: [facility] OR;  Service: Ophthalmology;  Laterality: Right;   LASER PHOTO ABLATION Right 05/12/2022   Procedure: LASER PHOTO ABLATION;  Surgeon: Sherrie George, MD;  Location: [facility] OR;  Service: Ophthalmology;  Laterality: Right;   MEMBRANE PEEL Right 05/12/2022   Procedure: MEMBRANE PEEL;  Surgeon: Sherrie George, MD;  Location: [facility] OR;  Service: Ophthalmology;  Laterality: Right;   SERUM PATCH Right 05/12/2022   Procedure: SERUM PATCH;  Surgeon: Sherrie George, MD;  Location: [facility] OR;  Service: Ophthalmology;  Laterality: Right;   tumor biopsy  09/2010   VEIN SURGERY  04/2020  DONE IN OFFICE   VULVA /PERINEUM BIOPSY      Home Medications:  Allergies as of 09/16/2022       Reactions   Penicillins Hives, Itching, Rash   All over body.        Medication List        Accurate as of Sep 16, 2022  3:54 PM. If you have any questions, ask your nurse or doctor.          acetaminophen 500 MG tablet Commonly known as: TYLENOL Take 1,000 mg by mouth every 6 (six) hours as needed for moderate pain (pain).   aspirin EC 81 MG tablet Take 81 mg by mouth daily. Swallow whole.   atorvastatin 40 MG  tablet Commonly known as: LIPITOR Take 40 mg by mouth every evening.   AZO CRANBERRY PO Take 2 tablets by mouth daily.   bacitracin-polymyxin b ophthalmic ointment Commonly known as: POLYSPORIN Place into the right eye 3 (three) times daily. apply to eye every 12 hours while awake   cetirizine 10 MG tablet Commonly known as: ZYRTEC Take 10 mg by mouth daily as needed for allergies.   Cholecalciferol 25 MCG (1000 UT) tablet Take 1,000 Units by mouth daily.   denosumab 60 MG/ML Soln injection Commonly known as: PROLIA Inject 60 mg into the skin every 6 (six) months.   fluticasone 50 MCG/ACT nasal spray Commonly known as: FLONASE Place 1 spray into both nostrils daily as needed for allergies.   gatifloxacin 0.5 % Soln Commonly known as: ZYMAXID Place 1 drop into the right eye 4 (four) times daily.   halobetasol 0.05 % ointment Commonly known as: ULTRAVATE AAA 1-2 times weekly as maintenance What changed:  how much to take how to take this when to take this reasons to take this additional instructions   multivitamin with minerals Tabs tablet Take 1 tablet by mouth daily. One A Day Women's 50+   omeprazole 20 MG capsule Commonly known as: PRILOSEC Take 20 mg by mouth every morning.   prednisoLONE acetate 1 % ophthalmic suspension Commonly known as: PRED FORTE Place 1 drop into the right eye 4 (four) times daily.   sulfamethoxazole-trimethoprim 800-160 MG tablet Commonly known as: BACTRIM DS Take 1 tablet by mouth every 12 (twelve) hours. Started by: Michiel Cowboy, PA-C        Allergies:  Allergies  Allergen Reactions   Penicillins Hives, Itching and Rash    All over body.     Family History: Family History  Problem Relation Age of Onset   Other Mother        left mastectomy 1998   Osteoporosis Mother    Breast cancer Mother 86   Uterine cancer Mother 62   Breast cancer Maternal Aunt 45    Social History:  reports that she has never smoked.  She has never used smokeless tobacco. She reports that she does not drink alcohol and does not use drugs.   Physical Exam: BP 117/69   Pulse 70   Ht 5\' 6"  (1.676 m)   Wt 164 lb (74.4 kg)   BMI 26.47 kg/m   Constitutional:  Well nourished. Alert and oriented, No acute distress. HEENT: [place] AT, moist mucus membranes.  Trachea midline Cardiovascular: No clubbing, cyanosis, or edema. Respiratory: Normal respiratory effort, no increased work of breathing. Neurologic: Grossly intact, no focal deficits, moving all 4 extremities. Psychiatric: Normal mood and affect.    Laboratory Data: Urinalysis  See EPIC and HPI  I have reviewed  the labs.    Pertinent Imaging: KUB no definitive stone seen  CLINICAL DATA:  Right flank pain   EXAM: CT ABDOMEN AND PELVIS WITHOUT CONTRAST   TECHNIQUE: Multidetector CT imaging of the abdomen and pelvis was performed following the standard protocol without IV contrast.   RADIATION DOSE REDUCTION: This exam was performed according to the departmental dose-optimization program which includes automated exposure control, adjustment of the mA and/or kV according to patient size and/or use of iterative reconstruction technique.   COMPARISON:  CT abdomen and pelvis dated May 24, 2019   FINDINGS: Lower chest: Moderate hiatal hernia.  No acute abnormality.   Hepatobiliary: No focal liver abnormality is seen. No gallstones, gallbladder wall thickening, or biliary dilatation.   Pancreas: Unremarkable. No pancreatic ductal dilatation or surrounding inflammatory changes.   Spleen: Normal in size without focal abnormality.   Adrenals/Urinary Tract: Bilateral adrenal glands are unremarkable. No hydronephrosis. Punctate bilateral nonobstructing renal stones.   Stomach/Bowel: Stomach is within normal limits. No evidence of bowel wall thickening, distention, or inflammatory changes.   Vascular/Lymphatic: Aortic atherosclerosis. No enlarged abdominal  or pelvic lymph nodes.   Reproductive: Uterus and bilateral adnexa are unremarkable.   Other: Left retroperitoneal mass measuring 5.1 x 4.7 x 6.7 cm, previously 4.8 x 4.7 x 6.1 cm. No abdominopelvic ascites.   Musculoskeletal: No acute or significant osseous findings.   IMPRESSION: 1. No acute findings in the abdomen or pelvis, including no evidence of obstructive uropathy. 2. Punctate bilateral nonobstructing renal stones. 3. Left retroperitoneal mass is slightly increased in size when compared with the prior exam, previously biopsied and consistent with known retroperitoneal schwannoma. 4. Moderate hiatal hernia. 5. Aortic Atherosclerosis (ICD10-I70.0).     Electronically Signed   By: Allegra Lai M.D.   On: 09/16/2022 11:49 I have independently reviewed the films.  See HPI.  Radiologist interpretation still pending  Assessment & Plan:    1. High risk hematuria -non smoker -work up 2021 - positive for nephrolithiasis -reports of gross heme - likely due to infection -UA negative for micro heme  -CT with punctate bilateral renal stones -will continue to monitor   2. Nephrolithiasis -KUB non diagnostic -non-contrast CT from today w/ bilateral nephrolithiasis  3. Suspected UTI -UA pyuria and bacteriuria -urine culture pending -start Septra DS BID x 7 days, will adjust if necessary once sensitivities are available    Return for pending urine culture results .  Cloretta Ned   [facility] [address], [address] [place], [place] [address] [phone]

## 2022-09-15 NOTE — Telephone Encounter (Signed)
Patient left message on triage line asking for something to take for the pain from kidney stone.

## 2022-09-16 ENCOUNTER — Ambulatory Visit (INDEPENDENT_AMBULATORY_CARE_PROVIDER_SITE_OTHER): Payer: Medicare Other | Admitting: Urology

## 2022-09-16 ENCOUNTER — Encounter: Payer: Self-pay | Admitting: Urology

## 2022-09-16 ENCOUNTER — Ambulatory Visit
Admission: RE | Admit: 2022-09-16 | Discharge: 2022-09-16 | Disposition: A | Discharge status: Home / Self Care | Payer: Medicare Other | Source: Ambulatory Visit | Attending: Urology | Admitting: Urology

## 2022-09-16 ENCOUNTER — Ambulatory Visit
Admission: RE | Admit: 2022-09-16 | Discharge: 2022-09-16 | Disposition: A | Discharge status: Home / Self Care | Payer: Medicare Other | Attending: Urology | Admitting: Urology

## 2022-09-16 VITALS — BP 117/69 | HR 70 | Ht 66.0 in | Wt 164.0 lb

## 2022-09-16 DIAGNOSIS — R3989 Other symptoms and signs involving the genitourinary system: Secondary | ICD-10-CM

## 2022-09-16 DIAGNOSIS — R319 Hematuria, unspecified: Secondary | ICD-10-CM

## 2022-09-16 DIAGNOSIS — N200 Calculus of kidney: Secondary | ICD-10-CM

## 2022-09-16 DIAGNOSIS — R310 Gross hematuria: Secondary | ICD-10-CM

## 2022-09-16 LAB — URINALYSIS, COMPLETE
Bilirubin, UA: NEGATIVE
Glucose, UA: NEGATIVE
Ketones, UA: NEGATIVE
Nitrite, UA: NEGATIVE
RBC, UA: NEGATIVE
Specific Gravity, UA: >1.03 — ABNORMAL HIGH (ref 1.005–1.030)
Urobilinogen, Ur: 0.2 mg/dL (ref 0.2–1.0)
pH, UA: 6 (ref 5.0–7.5)

## 2022-09-16 LAB — MICROSCOPIC EXAMINATION

## 2022-09-16 MED ORDER — SULFAMETHOXAZOLE-TRIMETHOPRIM 800-160 MG PO TABS
1.0000 | ORAL_TABLET | Freq: Two times a day (BID) | ORAL | 0 refills | Status: DC
Start: 2022-09-16 — End: 2023-01-22

## 2022-09-16 NOTE — Telephone Encounter (Signed)
Already has an appointment scheduled for today with [facility]

## 2022-09-21 LAB — CULTURE, URINE COMPREHENSIVE

## 2023-01-21 NOTE — Progress Notes (Signed)
01/27/23 4:51 PM   Emily Richard [birth year] [id number]  Referring provider:  Jerl Mina, MD [address] [place],  [place] [address]  Urological history: 1. High risk hematuria -non-smoker -CTU 2021 Mild right hydronephrosis and hydroureter extending down to a 4 by 2 by 2 mm in long axis right UVJ calculus.  Nonobstructive 0.8 cm in long axis left kidney lower pole renal calculus -cystoscopy 2021 NED -RUS (2022) no worrisome findings   2. Nephrolithiasis -stone composition 80% calcium oxalate dihydrate, 10% calcium oxalate monohydrate and 10% and 10% hydroxyapatite -06/2020 for left URS   3. rUTI's -contributing factors of age and vaginal atrophy -documented urine cultures over the last year  Sep 16, 2022-  E. coli             October 29, 2021 for E. coli  Chief Complaint  Patient presents with   Follow-up   Urinary Frequency    HPI: Emily Richard is a 72 y.o.female who presents today for possible UTI, urgency and painful urination.  Previous records reviewed.   She states that on Saturday Sunday she experienced bright red blood and then it faded.  This occurred after standing all day long and not drinking enough water.  She also had urgency and frequency.  Her symptoms have diminished greatly, but she would still like to be checked.  Patient denies any modifying or aggravating factors.  Patient denies any recent UTI's, dysuria or suprapubic/flank pain.  Patient denies any fevers, chills, nausea or vomiting.    UA unremarkable  PMH: Past Medical History:  Diagnosis Date   Arthritis    Bursitis    Family history of breast cancer    mother, aunt   GERD (gastroesophageal reflux disease)    History of hiatal hernia    History of kidney stones    Hyperlipidemia    Osteoporosis    Poor circulation    Schwannoma    Shortness of breath     Surgical History: Past Surgical History:  Procedure Laterality Date   25 GAUGE PARS PLANA  VITRECTOMY WITH 20 GAUGE MVR PORT FOR MACULAR HOLE Right 05/12/2022   Procedure: 25 GAUGE PARS PLANA VITRECTOMY WITH 20 GAUGE MVR PORT FOR MACULAR HOLE;  Surgeon: Sherrie George, MD;  Location: [facility] OR;  Service: Ophthalmology;  Laterality: Right;   BREAST EXCISIONAL BIOPSY Right 2003   NEG   COLONOSCOPY  01/2014   CYSTOSCOPY/URETEROSCOPY/HOLMIUM LASER/STENT PLACEMENT Left 06/24/2020   Procedure: CYSTOSCOPY/URETEROSCOPY/HOLMIUM LASER/STENT PLACEMENT;  Surgeon: Vanna Scotland, MD;  Location: [facility] ORS;  Service: Urology;  Laterality: Left;   GAS INSERTION Right 05/12/2022   Procedure: INSERTION OF GAS;  Surgeon: Sherrie George, MD;  Location: [facility] OR;  Service: Ophthalmology;  Laterality: Right;   LASER PHOTO ABLATION Right 05/12/2022   Procedure: LASER PHOTO ABLATION;  Surgeon: Sherrie George, MD;  Location: [facility] OR;  Service: Ophthalmology;  Laterality: Right;   MEMBRANE PEEL Right 05/12/2022   Procedure: MEMBRANE PEEL;  Surgeon: Sherrie George, MD;  Location: [facility] OR;  Service: Ophthalmology;  Laterality: Right;   SERUM PATCH Right 05/12/2022   Procedure: SERUM PATCH;  Surgeon: Sherrie George, MD;  Location: [facility] OR;  Service: Ophthalmology;  Laterality: Right;   tumor biopsy  09/2010   VEIN SURGERY  04/2020   DONE IN OFFICE   VULVA Ples Specter BIOPSY      Home Medications:  Allergies as of 01/22/2023       Reactions   Penicillins  Hives, Itching, Rash   All over body.        Medication List        Accurate as of January 22, 2023 11:59 PM. If you have any questions, ask your nurse or doctor.          STOP taking these medications    sulfamethoxazole-trimethoprim 800-160 MG tablet Commonly known as: BACTRIM DS Stopped by: Carollee Herter Chenel Wernli       TAKE these medications    acetaminophen 500 MG tablet Commonly known as: TYLENOL Take 1,000 mg by mouth every 6 (six) hours as needed for moderate pain (pain).   aspirin EC 81 MG tablet Take 81 mg by mouth daily. Swallow whole.    atorvastatin 40 MG tablet Commonly known as: LIPITOR Take 40 mg by mouth every evening.   AZO CRANBERRY PO Take 2 tablets by mouth daily.   bacitracin-polymyxin b ophthalmic ointment Commonly known as: POLYSPORIN Place into the right eye 3 (three) times daily. apply to eye every 12 hours while awake   cetirizine 10 MG tablet Commonly known as: ZYRTEC Take 10 mg by mouth daily as needed for allergies.   Cholecalciferol 25 MCG (1000 UT) tablet Take 1,000 Units by mouth daily.   denosumab 60 MG/ML Soln injection Commonly known as: PROLIA Inject 60 mg into the skin every 6 (six) months.   fluticasone 50 MCG/ACT nasal spray Commonly known as: FLONASE Place 1 spray into both nostrils daily as needed for allergies.   gatifloxacin 0.5 % Soln Commonly known as: ZYMAXID Place 1 drop into the right eye 4 (four) times daily.   halobetasol 0.05 % ointment Commonly known as: ULTRAVATE AAA 1-2 times weekly as maintenance   multivitamin with minerals Tabs tablet Take 1 tablet by mouth daily. One A Day Women's 50+   omeprazole 20 MG capsule Commonly known as: PRILOSEC Take 20 mg by mouth every morning.   prednisoLONE acetate 1 % ophthalmic suspension Commonly known as: PRED FORTE Place 1 drop into the right eye 4 (four) times daily.        Allergies:  Allergies  Allergen Reactions   Penicillins Hives, Itching and Rash    All over body.     Family History: Family History  Problem Relation Age of Onset   Other Mother        left mastectomy 1998   Osteoporosis Mother    Breast cancer Mother 19   Uterine cancer Mother 46   Breast cancer Maternal Aunt 22    Social History:  reports that she has never smoked. She has never been exposed to tobacco smoke. She has never used smokeless tobacco. She reports that she does not drink alcohol and does not use drugs.   Physical Exam: BP 129/71   Pulse 73   Ht 5\' 6"  (1.676 m)   Wt 168 lb (76.2 kg)   BMI 27.12 kg/m    Constitutional:  Well nourished. Alert and oriented, No acute distress. HEENT: [place] AT, moist mucus membranes.  Trachea midline Cardiovascular: No clubbing, cyanosis, or edema. Respiratory: Normal respiratory effort, no increased work of breathing. Neurologic: Grossly intact, no focal deficits, moving all 4 extremities. Psychiatric: Normal mood and affect.    Laboratory Data: Basic Metabolic Panel (BMP) Order: [id number] Component Ref Range & Units 2 mo ago  Glucose 70 - 110 mg/dL 91  Sodium 409 - 811 mmol/L 142  Potassium 3.6 - 5.1 mmol/L 4.5  Chloride 97 - 109 mmol/L 105  Carbon Dioxide (CO2) 22.0 -  32.0 mmol/L 32.7 High   Calcium 8.7 - 10.3 mg/dL 9.2  Urea Nitrogen (BUN) 7 - 25 mg/dL 10  Creatinine 0.6 - 1.1 mg/dL 0.7  Glomerular Filtration Rate (eGFR) >60 mL/min/1.73sq m 94  Comment: CKD-EPI (2021) does not include patient's race in the calculation of eGFR.  Monitoring changes of plasma creatinine and eGFR over time is useful for monitoring kidney function.  Interpretive Ranges for eGFR (CKD-EPI 2021):  eGFR:       >60 mL/min/1.73 sq. m - Normal eGFR:       30-59 mL/min/1.73 sq. m - Moderately Decreased eGFR:       15-29 mL/min/1.73 sq. m  - Severely Decreased eGFR:       < 15 mL/min/1.73 sq. m  - Kidney Failure   Note: These eGFR calculations do not apply in acute situations when eGFR is changing rapidly or patients on dialysis.  BUN/Crea Ratio 6.0 - 20.0 14.3  Anion Gap w/K 6.0 - 16.0 8.8  Resulting Agency [facility] - LAB   Specimen Collected: 10/26/22 08:47   Performed by: [facility] Last Resulted: 10/26/22 10:45  Received From: [facility]  Result Received: 11/11/22 16:48   Urinalysis Component     Latest Ref Rng 01/22/2023  Color, UA     Yellow  Yellow   Bilirubin, UA     Negative  Negative   Ketones, UA     Negative  Negative   Specific Gravity, UA     1.005 - 1.030  1.020   RBC, UA     Negative   Negative   pH, UA     5.0 - 7.5  7.0   Protein,UA     Negative/Trace  Negative   Nitrite, UA     Negative  Negative   Leukocytes,UA     Negative  Negative   Appearance Ur     Clear  Clear   Glucose, UA     Negative  Negative   Urobilinogen, Ur     0.2 - 1.0 mg/dL 0.2   Microscopic Examination See below:     Component     Latest Ref Rng 01/22/2023  WBC, UA     0 - 5 /hpf 0-5   RBC, Urine     0 - 2 /hpf 0-2   Epithelial Cells (non renal)     0 - 10 /hpf 0-10   Bacteria, UA     None seen/Few  Few   I have reviewed the labs.  See HPI.     Pertinent Imaging: N/A  Assessment & Plan:    1. High risk hematuria -non smoker -work up 2021 - positive for nephrolithiasis -CT with punctate bilateral renal stones -reports of gross heme -UA negative for micro heme   2. Nephrolithiasis -KUB non diagnostic -non-contrast CT from today w/ bilateral nephrolithiasis  3. Suspected UTI -UA unremarkable -urine culture pending --Since urine is unremarkable and she has very mild symptoms, we will wait on urine culture results before prescribing antibiotic  Return for pending urine culture results .  Cloretta Ned   [facility] [address], [address] [place], [place] [address] [phone]

## 2023-01-22 ENCOUNTER — Encounter: Payer: Self-pay | Admitting: Urology

## 2023-01-22 ENCOUNTER — Ambulatory Visit: Payer: Medicare Other | Admitting: Urology

## 2023-01-22 VITALS — BP 129/71 | HR 73 | Ht 66.0 in | Wt 168.0 lb

## 2023-01-22 DIAGNOSIS — R310 Gross hematuria: Secondary | ICD-10-CM

## 2023-01-22 DIAGNOSIS — R319 Hematuria, unspecified: Secondary | ICD-10-CM

## 2023-01-22 DIAGNOSIS — R3989 Other symptoms and signs involving the genitourinary system: Secondary | ICD-10-CM

## 2023-01-22 DIAGNOSIS — N200 Calculus of kidney: Secondary | ICD-10-CM | POA: Diagnosis not present

## 2023-01-22 LAB — URINALYSIS, COMPLETE
Bilirubin, UA: NEGATIVE
Glucose, UA: NEGATIVE
Ketones, UA: NEGATIVE
Leukocytes,UA: NEGATIVE
Nitrite, UA: NEGATIVE
Protein,UA: NEGATIVE
RBC, UA: NEGATIVE
Specific Gravity, UA: 1.02 (ref 1.005–1.030)
Urobilinogen, Ur: 0.2 mg/dL (ref 0.2–1.0)
pH, UA: 7 (ref 5.0–7.5)

## 2023-01-22 LAB — MICROSCOPIC EXAMINATION

## 2023-01-25 LAB — CULTURE, URINE COMPREHENSIVE

## 2023-01-27 ENCOUNTER — Telehealth: Payer: Self-pay | Admitting: Urology

## 2023-01-27 NOTE — Telephone Encounter (Signed)
I had sent her a MyChart message.    Mrs. Jagodzinski, Your urine culture was negative.  Are you still having symptoms? Shavy Beachem, PA-C  Since she actually saw blood in her urine last week, I do recommend that we obtain a renal ultrasound to make sure her kidneys are okay and also get her scheduled with Dr. Apolinar Junes to look inside her bladder again to make sure there is nothing concerning that caused the blood in the urine.

## 2023-02-01 NOTE — Telephone Encounter (Signed)
Spoke with patient and advised results She read mychart, no symptoms

## 2023-02-16 ENCOUNTER — Encounter (INDEPENDENT_AMBULATORY_CARE_PROVIDER_SITE_OTHER): Payer: Medicare Other | Admitting: Ophthalmology

## 2023-02-16 DIAGNOSIS — H2513 Age-related nuclear cataract, bilateral: Secondary | ICD-10-CM

## 2023-02-16 DIAGNOSIS — H35341 Macular cyst, hole, or pseudohole, right eye: Secondary | ICD-10-CM | POA: Diagnosis not present

## 2023-02-16 DIAGNOSIS — H353132 Nonexudative age-related macular degeneration, bilateral, intermediate dry stage: Secondary | ICD-10-CM

## 2023-02-16 DIAGNOSIS — H43812 Vitreous degeneration, left eye: Secondary | ICD-10-CM

## 2023-03-10 ENCOUNTER — Other Ambulatory Visit: Payer: Self-pay | Admitting: Urology

## 2023-03-10 DIAGNOSIS — N200 Calculus of kidney: Secondary | ICD-10-CM

## 2023-03-10 DIAGNOSIS — R310 Gross hematuria: Secondary | ICD-10-CM

## 2023-03-10 NOTE — Progress Notes (Signed)
I spoke with Emily Richard by telephone today and advised her that since she had an episode of bright red blood back in October, it is important that we follow through with getting a renal ultrasound for further evaluation and if no hydronephrosis or stones are seen on renal ultrasound, going forward with cystoscopy to complete the evaluation for the gross heme.  I explained the reason we want to do this is to make sure there is not any ureteral stone causing blockage or cancer in the kidneys, ureters or bladder.  She is in agreement.  I have placed orders for the renal ultrasound and she will contact me when she has it scheduled.

## 2023-03-22 ENCOUNTER — Ambulatory Visit
Admission: RE | Admit: 2023-03-22 | Discharge: 2023-03-22 | Disposition: A | Discharge status: Home / Self Care | Payer: Medicare Other | Source: Ambulatory Visit | Attending: Urology | Admitting: Urology

## 2023-03-22 DIAGNOSIS — R310 Gross hematuria: Secondary | ICD-10-CM | POA: Diagnosis present

## 2023-03-22 DIAGNOSIS — N200 Calculus of kidney: Secondary | ICD-10-CM | POA: Insufficient documentation

## 2023-05-25 ENCOUNTER — Ambulatory Visit: Payer: Medicare Other | Admitting: Urology

## 2023-05-25 ENCOUNTER — Other Ambulatory Visit
Admission: RE | Admit: 2023-05-25 | Discharge: 2023-05-25 | Disposition: A | Discharge status: Home / Self Care | Payer: Medicare Other | Source: Home / Self Care | Attending: Urology | Admitting: Urology

## 2023-05-25 ENCOUNTER — Other Ambulatory Visit: Payer: Self-pay | Admitting: Urology

## 2023-05-25 ENCOUNTER — Ambulatory Visit
Admission: RE | Admit: 2023-05-25 | Discharge: 2023-05-25 | Disposition: A | Discharge status: Home / Self Care | Payer: Medicare Other | Source: Ambulatory Visit | Attending: Urology | Admitting: Urology

## 2023-05-25 ENCOUNTER — Encounter: Payer: Self-pay | Admitting: Urology

## 2023-05-25 ENCOUNTER — Telehealth: Payer: Medicare Other | Admitting: Physician Assistant

## 2023-05-25 VITALS — BP 126/74 | HR 73 | Ht 66.0 in | Wt 173.0 lb

## 2023-05-25 DIAGNOSIS — N200 Calculus of kidney: Secondary | ICD-10-CM

## 2023-05-25 DIAGNOSIS — R109 Unspecified abdominal pain: Secondary | ICD-10-CM

## 2023-05-25 DIAGNOSIS — R310 Gross hematuria: Secondary | ICD-10-CM

## 2023-05-25 DIAGNOSIS — R3989 Other symptoms and signs involving the genitourinary system: Secondary | ICD-10-CM

## 2023-05-25 DIAGNOSIS — Z87442 Personal history of urinary calculi: Secondary | ICD-10-CM | POA: Diagnosis not present

## 2023-05-25 DIAGNOSIS — Z87898 Personal history of other specified conditions: Secondary | ICD-10-CM

## 2023-05-25 LAB — URINALYSIS, COMPLETE
Bilirubin, UA: NEGATIVE
Glucose, UA: NEGATIVE
Ketones, UA: NEGATIVE
Nitrite, UA: NEGATIVE
RBC, UA: NEGATIVE
Specific Gravity, UA: 1.015 (ref 1.005–1.030)
Urobilinogen, Ur: 0.2 mg/dL (ref 0.2–1.0)
pH, UA: 6 (ref 5.0–7.5)

## 2023-05-25 LAB — MICROSCOPIC EXAMINATION

## 2023-05-25 MED ORDER — HYDROCODONE-ACETAMINOPHEN 5-325 MG PO TABS
1.0000 | ORAL_TABLET | Freq: Four times a day (QID) | ORAL | 0 refills | Status: DC | PRN
Start: 2023-05-25 — End: 2023-06-17

## 2023-05-25 NOTE — Progress Notes (Signed)
 05/25/23 10:54 AM   Emily Richard [birth year] [id number]  Referring provider:  Valora Agent, MD [address] [place],  [place] [address]  Urological history: 1. High risk hematuria -non-smoker -CTU 2021 Mild right hydronephrosis and hydroureter extending down to a 4 by 2 by 2 mm in long axis right UVJ calculus.  Nonobstructive 0.8 cm in long axis left kidney lower pole renal calculus -cystoscopy 2021 NED -RUS (2022) no worrisome findings  -RUS (2024) mild left pelviectasis, left nephrolithiasis and stable schwannoma  2. Nephrolithiasis -stone composition 80% calcium  oxalate dihydrate, 10% calcium  oxalate monohydrate and 10% and 10% hydroxyapatite -06/2020 for left URS -RUS (2024) left nephrolithiasis   3. rUTI's -contributing factors of age and vaginal atrophy -documented urine cultures over the last year  January 22, 2023, mixed urogenital flora  Sep 16, 2022-  E. coli             Chief Complaint  Patient presents with   Follow-up    HPI: Emily Richard is a 73 y.o.female who presents today for same day urgent visit.    Previous records reviewed.   She reached out via MyChart this morning after being told during an e-visit that she should be seen in person.  She is having severe back pain across her lower back above her waistline.  This pain started yesterday.  It is continuing to get worse.  She describes it as a bad stabbing pain with some mild nausea.  KUB left renal calculus   She woke up with the pain yesterday.  The pain is located in her mid back.  She describes it as muscle spasms.  She said a hot shower made it feel better.  Movement makes the pain worse.  She has had some mild nausea.  She feels like it is exactly like she felt when she had kidney stones before.  Patient denies any modifying or aggravating factors.  Patient denies any recent UTI's, gross hematuria, dysuria or suprapubic/flank pain.  Patient denies any fevers, chills  or vomiting.    UA yellow clear, specific every 1.015, pH 6.0, trace protein, trace leukocyte, 6-10 WBCs, 0-2 RBCs, 0-10 epithelial cells, calcium  oxalate crystals are present, mucus threads present and a few bacteria.   PMH: Past Medical History:  Diagnosis Date   Arthritis    Bursitis    Family history of breast cancer    mother, aunt   GERD (gastroesophageal reflux disease)    History of hiatal hernia    History of kidney stones    Hyperlipidemia    Osteoporosis    Poor circulation    Schwannoma    Shortness of breath     Surgical History: Past Surgical History:  Procedure Laterality Date   25 GAUGE PARS PLANA VITRECTOMY WITH 20 GAUGE MVR PORT FOR MACULAR HOLE Right 05/12/2022   Procedure: 25 GAUGE PARS PLANA VITRECTOMY WITH 20 GAUGE MVR PORT FOR MACULAR HOLE;  Surgeon: Alvia Norleen BIRCH, MD;  Location: [facility] OR;  Service: Ophthalmology;  Laterality: Right;   BREAST EXCISIONAL BIOPSY Right 2003   NEG   COLONOSCOPY  01/2014   CYSTOSCOPY/URETEROSCOPY/HOLMIUM LASER/STENT PLACEMENT Left 06/24/2020   Procedure: CYSTOSCOPY/URETEROSCOPY/HOLMIUM LASER/STENT PLACEMENT;  Surgeon: Penne Knee, MD;  Location: [facility] ORS;  Service: Urology;  Laterality: Left;   GAS INSERTION Right 05/12/2022   Procedure: INSERTION OF GAS;  Surgeon: Alvia Norleen BIRCH, MD;  Location: [facility] OR;  Service: Ophthalmology;  Laterality: Right;   LASER PHOTO ABLATION Right  05/12/2022   Procedure: LASER PHOTO ABLATION;  Surgeon: Alvia Norleen BIRCH, MD;  Location: [facility] OR;  Service: Ophthalmology;  Laterality: Right;   MEMBRANE PEEL Right 05/12/2022   Procedure: MEMBRANE PEEL;  Surgeon: Alvia Norleen BIRCH, MD;  Location: [facility] OR;  Service: Ophthalmology;  Laterality: Right;   SERUM PATCH Right 05/12/2022   Procedure: SERUM PATCH;  Surgeon: Alvia Norleen BIRCH, MD;  Location: [facility] OR;  Service: Ophthalmology;  Laterality: Right;   tumor biopsy  09/2010   VEIN SURGERY  04/2020   DONE IN OFFICE   VULVA MARYBETH BIOPSY      Home  Medications:  Allergies as of 05/25/2023       Reactions   Penicillins Hives, Itching, Rash   All over body.        Medication List        Accurate as of May 25, 2023 10:54 AM. If you have any questions, ask your nurse or doctor.          STOP taking these medications    acetaminophen  500 MG tablet Commonly known as: TYLENOL  Stopped by: CLOTILDA CORNWALL       TAKE these medications    aspirin EC 81 MG tablet Take 81 mg by mouth daily. Swallow whole.   atorvastatin  40 MG tablet Commonly known as: LIPITOR Take 40 mg by mouth every evening.   AZO CRANBERRY PO Take 2 tablets by mouth daily.   bacitracin -polymyxin b  ophthalmic ointment Commonly known as: POLYSPORIN  Place into the right eye 3 (three) times daily. apply to eye every 12 hours while awake   cetirizine 10 MG tablet Commonly known as: ZYRTEC Take 10 mg by mouth daily as needed for allergies.   Cholecalciferol 25 MCG (1000 UT) tablet Take 1,000 Units by mouth daily.   denosumab 60 MG/ML Soln injection Commonly known as: PROLIA Inject 60 mg into the skin every 6 (six) months.   fluticasone  50 MCG/ACT nasal spray Commonly known as: FLONASE  Place 1 spray into both nostrils daily as needed for allergies.   gatifloxacin  0.5 % Soln Commonly known as: ZYMAXID  Place 1 drop into the right eye 4 (four) times daily.   halobetasol  0.05 % ointment Commonly known as: ULTRAVATE  AAA 1-2 times weekly as maintenance   HYDROcodone -acetaminophen  5-325 MG tablet Commonly known as: NORCO/VICODIN Take 1 tablet by mouth every 6 (six) hours as needed for moderate pain (pain score 4-6). Started by: Lion Fernandez   multivitamin with minerals Tabs tablet Take 1 tablet by mouth daily. One A Day Women's 50+   omeprazole 20 MG capsule Commonly known as: PRILOSEC Take 20 mg by mouth every morning.   prednisoLONE  acetate 1 % ophthalmic suspension Commonly known as: PRED FORTE  Place 1 drop into the right eye 4  (four) times daily.        Allergies:  Allergies  Allergen Reactions   Penicillins Hives, Itching and Rash    All over body.     Family History: Family History  Problem Relation Age of Onset   Other Mother        left mastectomy 1998   Osteoporosis Mother    Breast cancer Mother 109   Uterine cancer Mother 19   Breast cancer Maternal Aunt 30    Social History:  reports that she has never smoked. She has never been exposed to tobacco smoke. She has never used smokeless tobacco. She reports that she does not drink alcohol and does not use drugs.   Physical Exam: BP 126/74  Pulse 73   Ht 5' 6 (1.676 m)   Wt 173 lb (78.5 kg)   BMI 27.92 kg/m   Constitutional:  Well nourished. Alert and oriented, No acute distress. HEENT: [place] AT, moist mucus membranes.  Trachea midline, no masses. Cardiovascular: No clubbing, cyanosis, or edema. Respiratory: Normal respiratory effort, no increased work of breathing.  Neurologic: Grossly intact, no focal deficits, moving all 4 extremities. Psychiatric: Normal mood and affect.    Laboratory Data: CBC w/auto Differential (3 Part) Order: [id number] Component Ref Range & Units 1 mo ago  WBC (White Blood Cell Count) 4.1 - 10.2 10^3/uL 4.5  RBC (Red Blood Cell Count) 4.04 - 5.48 10^6/uL 4.33  Hemoglobin 12.0 - 15.0 gm/dL 86.6  Hematocrit 64.9 - 47.0 % 41.9  MCV (Mean Corpuscular Volume) 80.0 - 100.0 fl 96.8  MCH (Mean Corpuscular Hemoglobin) 27.0 - 31.2 pg 30.7  MCHC (Mean Corpuscular Hemoglobin Concentration) 32.0 - 36.0 gm/dL 68.2 Low   Platelet Count 150 - 450 10^3/uL 214  RDW-CV (Red Cell Distribution Width) 11.6 - 14.8 % 12.8  MPV (Mean Platelet Volume) 9.4 - 12.4 fl 9 Low   Neutrophils 1.50 - 7.80 10^3/uL 2.6  Lymphocytes 1.00 - 3.60 10^3/uL 1.4  Mixed Count 0.10 - 0.90 10^3/uL 0.5  Neutrophil % 32.0 - 70.0 % 57.6  Lymphocyte % 10.0 - 50.0 % 30.2  Mixed % 3.0 - 14.4 % 12.2  Resulting Agency [facility]  - LAB   Specimen Collected: 04/01/23 08:22   Performed by: [facility] Last Resulted: 04/01/23 12:18  Received From: [facility]  Result Received: 05/08/23 19:37   Lipid Panel w/calc LDL Order: [id number] Component Ref Range & Units 1 mo ago  Cholesterol, Total 100 - 200 mg/dL 874  Triglyceride 35 - 199 mg/dL 885  HDL (High Density Lipoprotein) Cholesterol 35.0 - 85.0 mg/dL 46.8  LDL Calculated 0 - 130 mg/dL 49  VLDL Cholesterol mg/dL 23  Cholesterol/HDL Ratio 2.4  Resulting Agency [facility]   Specimen Collected: 04/01/23 08:22   Performed by: [facility] Last Resulted: 04/01/23 13:34  Received From: [facility]  Result Received: 05/08/23 19:37    Comprehensive Metabolic Panel (CMP) Order: [id number] Component Ref Range & Units 1 mo ago  Glucose 70 - 110 mg/dL 90  Sodium 863 - 854 mmol/L 141  Potassium 3.6 - 5.1 mmol/L 5  Chloride 97 - 109 mmol/L 106  Carbon Dioxide (CO2) 22.0 - 32.0 mmol/L 32.2 High   Urea Nitrogen (BUN) 7 - 25 mg/dL 14  Creatinine 0.6 - 1.1 mg/dL 0.6  Glomerular Filtration Rate (eGFR) >60 mL/min/1.73sq m 97  Comment: CKD-EPI (2021) does not include patient's race in the calculation of eGFR.  Monitoring changes of plasma creatinine and eGFR over time is useful for monitoring kidney function.  Interpretive Ranges for eGFR (CKD-EPI 2021):  eGFR:       >60 mL/min/1.73 sq. m - Normal eGFR:       30-59 mL/min/1.73 sq. m - Moderately Decreased eGFR:       15-29 mL/min/1.73 sq. m  - Severely Decreased eGFR:       < 15 mL/min/1.73 sq. m  - Kidney Failure   Note: These eGFR calculations do not apply in acute situations when eGFR is changing rapidly or patients on dialysis.  Calcium  8.7 - 10.3 mg/dL 9.4  AST 8 - 39 U/L 22  ALT 5 - 38 U/L 18  Alk  Phos (alkaline Phosphatase) 34 - 104 U/L 101  Albumin 3.5 - 4.8 g/dL 4.3  Bilirubin, Total 0.3 - 1.2 mg/dL 0.8   Protein, Total 6.1 - 7.9 g/dL 7  A/G Ratio 1.0 - 5.0 gm/dL 1.6  Resulting Agency [facility]   Specimen Collected: 04/01/23 08:22   Performed by: [facility] Last Resulted: 04/01/23 13:34  Received From: [facility]  Result Received: 05/08/23 19:37    Urinalysis See EPIC and HPI I have reviewed the labs.  See HPI.     Pertinent Imaging: KUB left renal calculi, radiologist interpretation still pending I have independently reviewed the films.  See HPI.    Assessment & Plan:    1. High risk hematuria -non smoker -work up 2021 - positive for nephrolithiasis -reports of gross heme -UA negative for microscopic hematuria  -RUS - NED, cystoscopy pending  2. Nephrolithiasis -KUB non diagnostic -I discussed with her that this may be muscle skeletal pain and not renal colic as her KUB did not demonstrate any ureteral stones and her urinalysis was negative for microhematuria and offered the options of giving the pain a few more days to see if it will resolve itself or if she wanted to pursue a CT scan, she stated that her pain feels exactly as it did when she had stones in the past, so she would like to have a CT scan urgently.  3. Bilateral flank pain -Possibly muscle skeletal in origin, but she would like a CT scan for further evaluation to ensure that is not renal colic -She asked me what she can take for pain in the meantime while waiting for scan, I sent in 5 tablets of Vicodin 5/325, 1 p.o. every 6 hours.  For pain -Reviewed red flag signs   Return for Pending CT results .  CLOTILDA HELON RIGGERS   [facility] [address], [address] [place], [place] [address] [phone]

## 2023-05-25 NOTE — Progress Notes (Signed)
  Because we are not able to evaluate or manage kidney stones via a virtual urgent care e-visit, I feel your condition warrants further evaluation and I recommend that you be seen in a face-to-face visit. Please call your Urology office first thing this morning. If they are not available to evaluate you, then we want you to call your PCP office or be seen in person at local ER.   NOTE: There will be NO CHARGE for this E-Visit   If you are having a true medical emergency, please call 911.     For an urgent face to face visit, [facility] has multiple urgent care centers for your convenience.  Click the link below for the full list of locations and hours, walk-in wait times, appointment scheduling options and driving directions:  Urgent Care - [place], [place], [place], [place], [place], [place]  [place]     Your MyChart E-visit questionnaire answers were reviewed by a board certified advanced clinical practitioner to complete your personal care plan based on your specific symptoms.    Thank you for using e-Visits.

## 2023-05-26 ENCOUNTER — Ambulatory Visit
Admission: RE | Admit: 2023-05-26 | Discharge: 2023-05-26 | Disposition: A | Discharge status: Home / Self Care | Payer: Medicare Other | Source: Ambulatory Visit | Attending: Urology | Admitting: Urology

## 2023-05-26 ENCOUNTER — Other Ambulatory Visit: Payer: Self-pay | Admitting: Urology

## 2023-05-26 DIAGNOSIS — N200 Calculus of kidney: Secondary | ICD-10-CM | POA: Insufficient documentation

## 2023-05-26 DIAGNOSIS — R109 Unspecified abdominal pain: Secondary | ICD-10-CM | POA: Insufficient documentation

## 2023-05-26 MED ORDER — TIZANIDINE HCL 4 MG PO TABS: 4.0000 mg | ORAL_TABLET | Freq: Three times a day (TID) | ORAL | 1 refills | Status: AC

## 2023-05-28 LAB — CULTURE, URINE COMPREHENSIVE

## 2023-05-31 MED ORDER — CEFUROXIME AXETIL 500 MG PO TABS: 500.0000 mg | ORAL_TABLET | Freq: Two times a day (BID) | ORAL | 0 refills | Status: DC

## 2023-06-11 ENCOUNTER — Other Ambulatory Visit: Payer: Self-pay | Admitting: Family Medicine

## 2023-06-11 DIAGNOSIS — Z1231 Encounter for screening mammogram for malignant neoplasm of breast: Secondary | ICD-10-CM

## 2023-06-17 ENCOUNTER — Ambulatory Visit: Payer: Medicare Other | Admitting: Urology

## 2023-06-17 VITALS — BP 128/75 | HR 73

## 2023-06-17 DIAGNOSIS — N390 Urinary tract infection, site not specified: Secondary | ICD-10-CM

## 2023-06-17 DIAGNOSIS — R310 Gross hematuria: Secondary | ICD-10-CM

## 2023-06-17 DIAGNOSIS — N200 Calculus of kidney: Secondary | ICD-10-CM

## 2023-06-17 LAB — URINALYSIS, COMPLETE
Bilirubin, UA: NEGATIVE
Glucose, UA: NEGATIVE
Ketones, UA: NEGATIVE
Nitrite, UA: NEGATIVE
Protein,UA: NEGATIVE
RBC, UA: NEGATIVE
Specific Gravity, UA: 1.02 (ref 1.005–1.030)
Urobilinogen, Ur: 0.2 mg/dL (ref 0.2–1.0)
pH, UA: 5.5 (ref 5.0–7.5)

## 2023-06-17 LAB — MICROSCOPIC EXAMINATION: Epithelial Cells (non renal): >10 /[HPF] — AB (ref 0–10)

## 2023-06-17 MED ORDER — LIDOCAINE HCL URETHRAL/MUCOSAL 2 % EX GEL
1.0000 | Freq: Once | CUTANEOUS | Status: AC
Start: 2023-06-17 — End: 2023-06-17
  Administered 2023-06-17: 1 via URETHRAL

## 2023-06-17 NOTE — Progress Notes (Unsigned)
   06/17/23  CC:  Chief Complaint  Patient presents with   Cysto    HPI: 73 year old female with a personal history of gross/ microscopic hematuria, recurrent UTIs and nephrolithiasis who presents today for cystoscopy.  She had a follow-up renal ultrasound in 2024 that showed mild left pelviectasis otherwise unremarkable.  She is also since had a CT stone protocol this year was also fairly unremarkable with a little stone burden.  She has a new retroperitoneal schwannoma that was also seen.  Blood pressure 128/75, pulse 73. NED. A&Ox3.   No respiratory distress   Abd soft, NT, ND Normal external genitalia with patent urethral meatus  Cystoscopy Procedure Note  Patient identification was confirmed, informed consent was obtained, and patient was prepped using Betadine solution.  Lidocaine jelly was administered per urethral meatus.    Procedure: - Flexible cystoscope introduced, without any difficulty.   - Thorough search of the bladder revealed:    normal urethral meatus    normal urothelium    no stones    no ulcers     no tumors    no urethral polyps    no trabeculation  - Ureteral orifices were normal in position and appearance.  Post-Procedure: - Patient tolerated the procedure well  Assessment/ Plan:  1. Gross hematuria (Primary) Status post negative cystoscopy which is reassuring - Urinalysis, Complete - lidocaine (XYLOCAINE) 2 % jelly 1 Application  2. Nephrolithiasis Overall minimal stone burden  3. Recurrent UTI Continue previous management   Follow-up in 1 year or sooner [place] or sooner as needed  Vanna Scotland, MD

## 2023-06-17 NOTE — Patient Instructions (Signed)

## 2023-07-07 ENCOUNTER — Ambulatory Visit
Admission: RE | Admit: 2023-07-07 | Discharge: 2023-07-07 | Disposition: A | Discharge status: Home / Self Care | Payer: Medicare Other | Source: Ambulatory Visit | Attending: Family Medicine | Admitting: Family Medicine

## 2023-07-07 DIAGNOSIS — Z1231 Encounter for screening mammogram for malignant neoplasm of breast: Secondary | ICD-10-CM | POA: Diagnosis present

## 2023-09-03 ENCOUNTER — Ambulatory Visit: Payer: Self-pay | Admitting: Urology

## 2023-09-09 ENCOUNTER — Ambulatory Visit

## 2023-09-09 DIAGNOSIS — K641 Second degree hemorrhoids: Secondary | ICD-10-CM | POA: Diagnosis not present

## 2023-09-09 DIAGNOSIS — Z1211 Encounter for screening for malignant neoplasm of colon: Secondary | ICD-10-CM | POA: Diagnosis present

## 2023-09-09 DIAGNOSIS — D123 Benign neoplasm of transverse colon: Secondary | ICD-10-CM | POA: Diagnosis not present

## 2023-09-27 NOTE — Progress Notes (Unsigned)
 No chief complaint on file.    HPI:      [name] is a 73 y.o. [phone] who LMP was No LMP recorded. Patient is postmenopausal., presents today for her MEDICARE breast/pelvic examination. Her menses are absent due to menopause.  She does not have PMB. No vasomotor sx. Hx of mild uterine prolapse/cystocele 2/20.   Sex activity: not sexually active. She does have vaginal dryness. [name]   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 Rx RF due. Needs yearly f/u. Has 1 area perineal area that feels like it is going to tear.    Last Pap: 10/27/17  Results were: no abnormalities /neg HPV DNA.    Last mammogram: 07/07/23 with PCP;  Results were: normal--routine follow-up in 12 months; has appt 3/23 There is a FH of breast cancer in her mom and mat aunt, genetic testing not indicated. There is no FH of ovarian cancer. The patient does self-breast exams.   Colonoscopy: colonoscopy 2015 without abnormalities. Repeat due after 10 yrs. Pt initially thought 5 yrs due to FH polyps in sister, but GI said 10 yrs. Sched by PCP.  DEXA: osteoporosis by PCP, followed by PCP; tx declined in past   Tobacco use: The patient denies current or previous tobacco use. Alcohol use: none No drug use Exercise: moderately active   She does get adequate calcium  and Vitamin D in her diet.   Labs with PCP   Past Medical History:  Diagnosis Date   Arthritis    Bursitis    Family history of breast cancer    mother, aunt   GERD (gastroesophageal reflux disease)    History of hiatal hernia    History of kidney stones    Hyperlipidemia    Osteoporosis    Poor circulation    Schwannoma    Shortness of breath     Past Surgical History:  Procedure Laterality Date   25 GAUGE PARS PLANA VITRECTOMY WITH 20 GAUGE MVR PORT  FOR MACULAR HOLE Right 05/12/2022   Procedure: 25 GAUGE PARS PLANA VITRECTOMY WITH 20 GAUGE MVR PORT FOR MACULAR HOLE;  Surgeon: Rexene Catching, MD;  [facility];  Service: Ophthalmology;  Laterality: Right;   BREAST EXCISIONAL BIOPSY Right 2003   NEG   COLONOSCOPY  01/2014   CYSTOSCOPY/URETEROSCOPY/HOLMIUM LASER/STENT PLACEMENT Left 06/24/2020   Procedure: CYSTOSCOPY/URETEROSCOPY/HOLMIUM LASER/STENT PLACEMENT;  Surgeon: Dustin Gimenez, MD;  Location: [facility] ORS;  Service: Urology;  Laterality: Left;   GAS INSERTION Right 05/12/2022   Procedure: INSERTION OF GAS;  Surgeon: Rexene Catching, MD;  Location: [facility] OR;  Service: Ophthalmology;  Laterality: Right;   LASER PHOTO ABLATION Right 05/12/2022   Procedure: LASER PHOTO ABLATION;  Surgeon: Rexene Catching, MD;  Location: [facility] OR;  Service: Ophthalmology;  Laterality: Right;   MEMBRANE PEEL Right 05/12/2022   Procedure: MEMBRANE PEEL;  Surgeon: Rexene Catching, MD;  Location: [facility] OR;  Service: Ophthalmology;  Laterality: Right;   SERUM PATCH Right 05/12/2022   Procedure: SERUM PATCH;  Surgeon: Rexene Catching, MD;  Location: [facility] OR;  Service: Ophthalmology;  Laterality: Right;   tumor biopsy  09/2010   VEIN SURGERY  04/2020   DONE IN OFFICE   VULVA /PERINEUM BIOPSY      Family History  Problem  Relation Age of Onset   Other Mother        left mastectomy 1998   Osteoporosis Mother    Breast cancer Mother 29   Uterine cancer Mother 42   Breast cancer Maternal Aunt 87    Social History   Socioeconomic History   Marital status: Married    Spouse name: Not on file   Number of children: Not on file   Years of education: Not on file   Highest education level: Not on file  Occupational History   Not on file  Tobacco Use   Smoking status: Never    Passive exposure: Never   Smokeless tobacco: Never  Vaping Use   Vaping status: Never Used  Substance and Sexual Activity   Alcohol use: No   Drug use: No   Sexual activity: Not Currently     Birth control/protection: Post-menopausal  Other Topics Concern   Not on file  Social History Narrative   Not on file   Social Drivers of Health   Financial Resource Strain: Low Risk  (07/28/2023)   Received [facility]   Overall Financial Resource Strain (CARDIA)    Difficulty of Paying Living Expenses: Not hard at all  Food Insecurity: No Food Insecurity (07/28/2023)   Received from [facility]   Hunger Vital Sign    Worried About Running Out of Food in the Last Year: Never true    Ran Out of Food in the Last Year: Never true  Transportation Needs: No Transportation Needs (07/28/2023)   Received from [facility]    In the past 12 months, has lack of transportation kept you from medical appointments or from getting medications?: No    Lack of Transportation (Non-Medical): No  Physical Activity: Not on file  Stress: Not on file  Social Connections: Not on file  Intimate Partner Violence: Not At Risk (05/12/2022)   Humiliation, Afraid, Rape, and Kick questionnaire    Fear of Current or Ex-Partner: No    Emotionally Abused: No    Physically Abused: No    Sexually Abused: No    Current Outpatient Medications on File Prior to Visit  Medication Sig Dispense Refill   aspirin EC 81 MG tablet Take 81 mg by mouth daily. Swallow whole.     atorvastatin  (LIPITOR) 40 MG tablet Take 40 mg by mouth every evening.  11   Cholecalciferol 25 MCG (1000 UT) tablet Take 1,000 Units by mouth daily.     Cranberry-Vitamin C-Probiotic (AZO CRANBERRY PO) Take 2 tablets by mouth daily.     denosumab (PROLIA) 60 MG/ML SOLN injection Inject 60 mg into the skin every 6 (six) months.     fluticasone  (FLONASE ) 50 MCG/ACT nasal spray Place 1 spray into both nostrils daily as needed for allergies.     gatifloxacin  (ZYMAXID ) 0.5 % SOLN SMARTSIG:In Eye(s)     halobetasol  (ULTRAVATE ) 0.05 % ointment AAA 1-2 times weekly as  maintenance 50 g 0   meloxicam  (MOBIC ) 15 MG tablet Take 15 mg by mouth daily.     Multiple Vitamin (MULTIVITAMIN WITH MINERALS) TABS tablet Take 1 tablet by mouth daily. One A Day Women's 50+     omeprazole (PRILOSEC) 20 MG capsule Take 20 mg by mouth every morning.     prednisoLONE  acetate (PRED FORTE ) 1 % ophthalmic suspension Place 1 drop into the left eye 4 (four) times daily.     Current Facility-Administered Medications  on File Prior to Visit  Medication Dose Route Frequency Provider Last Rate Last Admin   triamcinolone  acetonide (KENALOG ) 10 MG/ML injection 10 mg  10 mg Other Once Stover, Titorya, DPM          ROS:  Review of Systems  Constitutional:  Negative for fatigue, fever and unexpected weight change.  Respiratory:  Negative for cough, shortness of breath and wheezing.   Cardiovascular:  Negative for chest pain, palpitations and leg swelling.  Gastrointestinal:  Negative for blood in stool, constipation, diarrhea, nausea and vomiting.  Endocrine: Negative for cold intolerance, heat intolerance and polyuria.  Genitourinary:  Negative for dyspareunia, dysuria, flank pain, frequency, genital sores, hematuria, menstrual problem, pelvic pain, urgency, vaginal bleeding, vaginal discharge and vaginal pain.  Musculoskeletal:  Negative for back pain, joint swelling and myalgias.  Skin:  Negative for rash.  Neurological:  Negative for dizziness, syncope, light-headedness, numbness and headaches.  Hematological:  Negative for adenopathy.  Psychiatric/Behavioral:  Negative for agitation, confusion, sleep disturbance and suicidal ideas. The patient is not nervous/anxious.      Objective: There were no vitals taken for this visit.   Physical Exam Constitutional:      Appearance: She is well-developed.  Genitourinary:     Vulva normal.     Genitourinary Comments: LS UNDER CONTROL; MILD, GRADE 1 CYSTOCELE     Right Labia: No rash, tenderness or lesions.    Left Labia: No  tenderness, lesions or rash.    No vaginal discharge, erythema or tenderness.      Right Adnexa: not tender and no mass present.    Left Adnexa: not tender and no mass present.    No cervical motion tenderness, friability or polyp.     Uterus is not enlarged or tender.  Breasts:    Right: No mass, nipple discharge, skin change or tenderness.     Left: No mass, nipple discharge, skin change or tenderness.  Neck:     Thyroid: No thyromegaly.  Cardiovascular:     Rate and Rhythm: Normal rate and regular rhythm.     Heart sounds: Normal heart sounds. No murmur heard. Pulmonary:     Effort: Pulmonary effort is normal.     Breath sounds: Normal breath sounds.  Abdominal:     Palpations: Abdomen is soft.     Tenderness: There is no abdominal tenderness. There is no guarding or rebound.  Musculoskeletal:        General: Normal range of motion.     Cervical back: Normal range of motion.  Lymphadenopathy:     Cervical: No cervical adenopathy.  Neurological:     General: No focal deficit present.     Mental Status: She is alert and oriented to person, place, and time.     Cranial Nerves: No cranial nerve deficit.  Skin:    General: Skin is warm and dry.  Psychiatric:        Mood and Affect: Mood normal.        Behavior: Behavior normal.        Thought Content: Thought content normal.        Judgment: Judgment normal.  Vitals reviewed.     Assessment/Plan: Encounter for annual routine gynecological examination - Plan: MEDICARE breast/pelvic  Lichen sclerosus - Plan: halobetasol  (ULTRAVATE ) 0.05 % ointment; Rx RF. Using 1-2 times wkly with sx control; recheck in 1 yr due to slight risk of SCC  Try 1 sample prem vag crm to perineal area since tissue feels thin  to pt. Normal on exam except mod atrophy. F/u prn.   Encounter for screening mammogram for malignant neoplasm of breast--pt has mammo appt.   No orders of the defined types were placed in this encounter.            GYN  counsel breast self exam, mammography screening, menopause, adequate intake of calcium  and vitamin D, diet and exercise     F/U  No follow-ups on file. annual  Emily B. Legend Pecore, PA-C 09/27/2023 8:32 PM

## 2023-09-28 ENCOUNTER — Ambulatory Visit (INDEPENDENT_AMBULATORY_CARE_PROVIDER_SITE_OTHER): Admitting: Obstetrics and Gynecology

## 2023-09-28 ENCOUNTER — Encounter: Payer: Self-pay | Admitting: Obstetrics and Gynecology

## 2023-09-28 ENCOUNTER — Other Ambulatory Visit (HOSPITAL_COMMUNITY)
Admission: RE | Admit: 2023-09-28 | Discharge: 2023-09-28 | Disposition: A | Discharge status: Home / Self Care | Source: Ambulatory Visit | Attending: Obstetrics and Gynecology | Admitting: Obstetrics and Gynecology

## 2023-09-28 VITALS — BP 117/65 | HR 69 | Ht 66.0 in | Wt 175.0 lb

## 2023-09-28 DIAGNOSIS — Z1211 Encounter for screening for malignant neoplasm of colon: Secondary | ICD-10-CM

## 2023-09-28 DIAGNOSIS — Z01419 Encounter for gynecological examination (general) (routine) without abnormal findings: Secondary | ICD-10-CM | POA: Diagnosis not present

## 2023-09-28 DIAGNOSIS — Z1231 Encounter for screening mammogram for malignant neoplasm of breast: Secondary | ICD-10-CM

## 2023-09-28 DIAGNOSIS — L900 Lichen sclerosus et atrophicus: Secondary | ICD-10-CM

## 2023-09-28 DIAGNOSIS — Z124 Encounter for screening for malignant neoplasm of cervix: Secondary | ICD-10-CM | POA: Diagnosis present

## 2023-09-28 MED ORDER — HALOBETASOL PROPIONATE 0.05 % EX OINT
TOPICAL_OINTMENT | CUTANEOUS | 0 refills | Status: AC
Start: 2023-09-28 — End: ?

## 2023-09-28 NOTE — Patient Instructions (Signed)
 I value your feedback and you entrusting [place] with your care. If you get a [place] patient survey, I would appreciate you taking the time to let us know about your experience today. Thank you! ? ? ?

## 2023-09-29 LAB — CYTOLOGY - PAP: Diagnosis: NEGATIVE

## 2023-11-18 ENCOUNTER — Other Ambulatory Visit: Payer: Self-pay

## 2023-11-18 MED ORDER — TAMSULOSIN HCL 0.4 MG PO CAPS: 0.4000 mg | ORAL_CAPSULE | Freq: Every day | ORAL | 0 refills | Status: AC

## 2023-11-18 NOTE — Telephone Encounter (Signed)
 Called and talked to patient no fever. And she understood

## 2023-11-18 NOTE — Telephone Encounter (Signed)
 Patient called to follow up on her mychart message to [facility]. I let her know that Emily Richard is out of the office this week, and that I would forward her message to another provider.

## 2023-11-21 ENCOUNTER — Other Ambulatory Visit: Payer: Self-pay | Admitting: Urology

## 2023-11-21 DIAGNOSIS — R310 Gross hematuria: Secondary | ICD-10-CM

## 2023-11-21 DIAGNOSIS — R109 Unspecified abdominal pain: Secondary | ICD-10-CM

## 2023-11-21 DIAGNOSIS — N200 Calculus of kidney: Secondary | ICD-10-CM

## 2023-11-21 NOTE — Progress Notes (Signed)
 11/23/23 2:52 PM   Emily Richard October 31, 1950 [id number]  Referring provider:  Valora Agent, MD [address] [place],  [place] [address]  Urological history: 1. High risk hematuria -non-smoker -CTU 2021 Mild right hydronephrosis and hydroureter extending down to a 4 by 2 by 2 mm in long axis right UVJ calculus.  Nonobstructive 0.8 cm in long axis left kidney lower pole renal calculus -cystoscopy 2021 NED -RUS (2022) no worrisome findings  -RUS (2024) mild left pelviectasis, left nephrolithiasis and stable schwannoma -non contrast CT (05/2023) bilateral lower pole stones -cysto (05/2023) NED   2. Nephrolithiasis -stone composition 80% calcium  oxalate dihydrate, 10% calcium  oxalate monohydrate and 10% and 10% hydroxyapatite -06/2020 for left URS -RUS (2024) left nephrolithiasis -non contrast CT (05/2023) bilateral lower pole stones   3. rUTI's -contributing factors of age and vaginal atrophy -documented urine cultures over the last year -May 25, 2023 - streptococcus gallolytious ssp pasteurianus  -January 22, 2023, mixed urogenital flora              Chief Complaint  Patient presents with   Nephrolithiasis    HPI: Emily Richard is a 73 y.o. woman who presents today for possible stone.    Previous records reviewed.   She reached out via MyChart last week for symptoms of upset stomach, left sided back pain and into the groin area associated with gross hematuria.  She states that the pain continued intermittently and also traveled from her left flank into her left waist down into her left groin.  She has been pain-free for the last day and a half, but she states she has not noticed that she has passed any fragments.  Patient denies any modifying or aggravating factors.  Patient denies any recent UTI's or dysuria.  Patient denies any fevers, chills, nausea or vomiting.    UA positive for leukocytes, squames, mucus and bacteria.    KUB no  calcifications suspicious for ureteral stone   PMH: Past Medical History:  Diagnosis Date   Arthritis    Bursitis    Family history of breast cancer    mother, aunt   GERD (gastroesophageal reflux disease)    History of hiatal hernia    History of kidney stones    Hyperlipidemia    Osteoporosis    Poor circulation    Schwannoma    Shortness of breath     Surgical History: Past Surgical History:  Procedure Laterality Date   25 GAUGE PARS PLANA VITRECTOMY WITH 20 GAUGE MVR PORT FOR MACULAR HOLE Right 05/12/2022   Procedure: 25 GAUGE PARS PLANA VITRECTOMY WITH 20 GAUGE MVR PORT FOR MACULAR HOLE;  Surgeon: Alvia Norleen BIRCH, MD;  Location: [facility] OR;  Service: Ophthalmology;  Laterality: Right;   BREAST EXCISIONAL BIOPSY Right 2003   NEG   COLONOSCOPY  01/2014   CYSTOSCOPY/URETEROSCOPY/HOLMIUM LASER/STENT PLACEMENT Left 06/24/2020   Procedure: CYSTOSCOPY/URETEROSCOPY/HOLMIUM LASER/STENT PLACEMENT;  Surgeon: Penne Knee, MD;  Location: [facility] ORS;  Service: Urology;  Laterality: Left;   GAS INSERTION Right 05/12/2022   Procedure: INSERTION OF GAS;  Surgeon: Alvia Norleen BIRCH, MD;  Location[facility] OR;  Service: Ophthalmology;  Laterality: Right;   LASER PHOTO ABLATION Right 05/12/2022   Procedure: LASER PHOTO ABLATION;  Surgeon: Alvia Norleen BIRCH, MD;  Location: [facility] OR;  Service: Ophthalmology;  Laterality: Right;   MEMBRANE PEEL Right 05/12/2022   Procedure: MEMBRANE PEEL;  Surgeon: Alvia Norleen BIRCH, MD;  Location: [facility] OR;  Service: Ophthalmology;  Laterality:  Right;   SERUM PATCH Right 05/12/2022   Procedure: SERUM PATCH;  Surgeon: Alvia Norleen BIRCH, MD;  Location: [facility] OR;  Service: Ophthalmology;  Laterality: Right;   tumor biopsy  09/2010   VEIN SURGERY  04/2020   DONE IN OFFICE   VULVA Emily Richard BIOPSY      Home Medications:  Allergies as of 11/23/2023       Reactions   Penicillins Hives, Itching, Rash   All over body.        Medication List        Accurate as of November 23, 2023   2:52 PM. If you have any questions, ask your nurse or doctor.          aspirin EC 81 MG tablet Take 81 mg by mouth daily. Swallow whole.   atorvastatin  40 MG tablet Commonly known as: LIPITOR Take 40 mg by mouth every evening.   AZO CRANBERRY PO Take 2 tablets by mouth daily.   Cholecalciferol 25 MCG (1000 UT) tablet Take 1,000 Units by mouth daily.   denosumab 60 MG/ML Soln injection Commonly known as: PROLIA Inject 60 mg into the skin every 6 (six) months.   fluticasone  50 MCG/ACT nasal spray Commonly known as: FLONASE  Place 1 spray into both nostrils daily as needed for allergies.   halobetasol  0.05 % ointment Commonly known as: ULTRAVATE  AAA 1-2 times weekly as maintenance   multivitamin with minerals Tabs tablet Take 1 tablet by mouth daily. One A Day Women's 50+   omeprazole 20 MG capsule Commonly known as: PRILOSEC Take 20 mg by mouth every morning.   oxyCODONE -acetaminophen  5-325 MG tablet Commonly known as: PERCOCET/ROXICET Take 1 tablet by mouth every 4 (four) hours as needed for up to 5 days for severe pain (pain score 7-10). Started by: Emily CORNWALL   tamsulosin  0.4 MG Caps capsule Commonly known as: FLOMAX  Take 1 capsule (0.4 mg total) by mouth daily.        Allergies:  Allergies  Allergen Reactions   Penicillins Hives, Itching and Rash    All over body.     Family History: Family History  Problem Relation Age of Onset   Other Mother        left mastectomy 1998   Osteoporosis Mother    Breast cancer Mother 61   Uterine cancer Mother 95   Breast cancer Maternal Aunt 31    Social History:  reports that she has never smoked. She has never been exposed to tobacco smoke. She has never used smokeless tobacco. She reports that she does not drink alcohol and does not use drugs.   Physical Exam: BP 119/75   Pulse 71   Ht 5' 6 (1.676 m)   Wt 168 lb (76.2 kg)   BMI 27.12 kg/m   Constitutional:  Well nourished. Alert and oriented,  No acute distress. HEENT: [place] AT, moist mucus membranes.  Trachea midline Cardiovascular: No clubbing, cyanosis, or edema. Respiratory: Normal respiratory effort, no increased work of breathing. Neurologic: Grossly intact, no focal deficits, moving all 4 extremities. Psychiatric: Normal mood and affect.    Laboratory Data: See EPIC and HPI I have reviewed the labs.  See HPI.     Pertinent Imaging: KUB no calcifications suspicious for renal stones I have independently reviewed the films.  See HPI.   Radiologist interpretation still pending.   Assessment & Plan:    1. High risk hematuria -non smoker -work up 2021 and 2024- positive for nephrolithiasis  2. Nephrolithiasis - bilateral  nephrolithiasis  3. Left flank pain - She would like to pursue a CT scan at this time so that she can evaluate the location and size of her left renal stone.  She is considering whether or not to undergo definitive stone treatment for the left renal stone, but will have further discussions once the CTs renal stone study results are back - given #5 Percocet 5/325, q 4 hours for pain - Reviewed return to clinic signs intractable flank pain, intractable vomiting, fevers  Return for I will call patient with results.  Emily Richard   [facility] [address], [address] [place], [place] [address] [phone]

## 2023-11-23 ENCOUNTER — Ambulatory Visit
Admission: RE | Admit: 2023-11-23 | Discharge: 2023-11-23 | Disposition: A | Discharge status: Home / Self Care | Attending: Urology | Admitting: Urology

## 2023-11-23 ENCOUNTER — Encounter: Payer: Self-pay | Admitting: Urology

## 2023-11-23 ENCOUNTER — Ambulatory Visit
Admission: RE | Admit: 2023-11-23 | Discharge: 2023-11-23 | Disposition: A | Discharge status: Home / Self Care | Source: Ambulatory Visit | Attending: Urology | Admitting: Urology

## 2023-11-23 ENCOUNTER — Ambulatory Visit: Admitting: Urology

## 2023-11-23 VITALS — BP 119/75 | HR 71 | Ht 66.0 in | Wt 168.0 lb

## 2023-11-23 DIAGNOSIS — R109 Unspecified abdominal pain: Secondary | ICD-10-CM

## 2023-11-23 DIAGNOSIS — R310 Gross hematuria: Secondary | ICD-10-CM

## 2023-11-23 DIAGNOSIS — N200 Calculus of kidney: Secondary | ICD-10-CM

## 2023-11-23 DIAGNOSIS — R319 Hematuria, unspecified: Secondary | ICD-10-CM

## 2023-11-23 LAB — URINALYSIS, COMPLETE
Bilirubin, UA: NEGATIVE
Glucose, UA: NEGATIVE
Nitrite, UA: NEGATIVE
RBC, UA: NEGATIVE
Specific Gravity, UA: 1.025 (ref 1.005–1.030)
Urobilinogen, Ur: 1 mg/dL (ref 0.2–1.0)
pH, UA: 6 (ref 5.0–7.5)

## 2023-11-23 LAB — MICROSCOPIC EXAMINATION
Epithelial Cells (non renal): >10 /HPF — AB (ref 0–10)
WBC, UA: >30 /HPF — AB (ref 0–5)

## 2023-11-23 MED ORDER — OXYCODONE-ACETAMINOPHEN 5-325 MG PO TABS: 1.0000 | ORAL_TABLET | ORAL | 0 refills | Status: AC | PRN

## 2023-11-26 LAB — CULTURE, URINE COMPREHENSIVE

## 2023-11-27 ENCOUNTER — Ambulatory Visit: Payer: Self-pay | Admitting: Urology

## 2023-12-02 ENCOUNTER — Ambulatory Visit
Admission: RE | Admit: 2023-12-02 | Discharge: 2023-12-02 | Disposition: A | Discharge status: Home / Self Care | Source: Ambulatory Visit | Attending: Urology | Admitting: Urology

## 2023-12-02 DIAGNOSIS — R109 Unspecified abdominal pain: Secondary | ICD-10-CM | POA: Insufficient documentation

## 2023-12-02 DIAGNOSIS — N200 Calculus of kidney: Secondary | ICD-10-CM | POA: Insufficient documentation

## 2023-12-02 DIAGNOSIS — R310 Gross hematuria: Secondary | ICD-10-CM | POA: Diagnosis present

## 2023-12-15 ENCOUNTER — Other Ambulatory Visit: Payer: Self-pay | Admitting: Physician Assistant

## 2024-02-15 ENCOUNTER — Encounter (INDEPENDENT_AMBULATORY_CARE_PROVIDER_SITE_OTHER): Payer: Medicare Other | Admitting: Ophthalmology

## 2024-02-15 DIAGNOSIS — H35341 Macular cyst, hole, or pseudohole, right eye: Secondary | ICD-10-CM

## 2024-02-15 DIAGNOSIS — H353132 Nonexudative age-related macular degeneration, bilateral, intermediate dry stage: Secondary | ICD-10-CM | POA: Diagnosis not present

## 2024-02-15 DIAGNOSIS — H43812 Vitreous degeneration, left eye: Secondary | ICD-10-CM | POA: Diagnosis not present

## 2024-06-20 ENCOUNTER — Ambulatory Visit: Payer: Medicare Other | Admitting: Urology

## 2025-02-14 ENCOUNTER — Encounter (INDEPENDENT_AMBULATORY_CARE_PROVIDER_SITE_OTHER): Admitting: Ophthalmology
